# Patient Record
Sex: Female | Born: 1960 | Race: Black or African American | Hispanic: No | Marital: Married | Smoking: Never smoker
Health system: Southern US, Community
[De-identification: ages and names within clinical notes are randomized; demographics above are authoritative.]

## PROBLEM LIST (undated history)

## (undated) DIAGNOSIS — K219 Gastro-esophageal reflux disease without esophagitis: Secondary | ICD-10-CM

## (undated) DIAGNOSIS — M1990 Unspecified osteoarthritis, unspecified site: Secondary | ICD-10-CM

## (undated) DIAGNOSIS — I10 Essential (primary) hypertension: Secondary | ICD-10-CM

## (undated) DIAGNOSIS — Z8719 Personal history of other diseases of the digestive system: Secondary | ICD-10-CM

## (undated) HISTORY — DX: Essential (primary) hypertension: I10

## (undated) HISTORY — PX: LAPAROSCOPIC GASTRIC BAND REMOVAL WITH LAPAROSCOPIC GASTRIC SLEEVE RESECTION: SHX6498

## (undated) HISTORY — DX: Morbid (severe) obesity due to excess calories: E66.01

## (undated) HISTORY — PX: ROTATOR CUFF REPAIR: SHX139

---

## 2002-06-30 ENCOUNTER — Other Ambulatory Visit: Admission: RE | Admit: 2002-06-30 | Discharge: 2002-06-30 | Payer: Self-pay | Admitting: *Deleted

## 2002-07-28 ENCOUNTER — Encounter: Admission: RE | Admit: 2002-07-28 | Discharge: 2002-07-28 | Payer: Self-pay | Admitting: *Deleted

## 2002-08-02 ENCOUNTER — Encounter: Admission: RE | Admit: 2002-08-02 | Discharge: 2002-08-02 | Payer: Self-pay | Admitting: *Deleted

## 2003-05-26 ENCOUNTER — Encounter (INDEPENDENT_AMBULATORY_CARE_PROVIDER_SITE_OTHER): Payer: Self-pay | Admitting: *Deleted

## 2003-05-26 LAB — CONVERTED CEMR LAB

## 2005-07-31 ENCOUNTER — Emergency Department (HOSPITAL_COMMUNITY): Admission: EM | Admit: 2005-07-31 | Discharge: 2005-07-31 | Payer: Self-pay | Admitting: Family Medicine

## 2005-08-20 ENCOUNTER — Ambulatory Visit: Payer: Self-pay | Admitting: Internal Medicine

## 2005-09-14 ENCOUNTER — Ambulatory Visit: Payer: Self-pay | Admitting: Internal Medicine

## 2005-09-23 ENCOUNTER — Ambulatory Visit: Payer: Self-pay | Admitting: Internal Medicine

## 2005-09-25 ENCOUNTER — Ambulatory Visit (HOSPITAL_COMMUNITY): Admission: RE | Admit: 2005-09-25 | Discharge: 2005-09-25 | Payer: Self-pay | Admitting: Internal Medicine

## 2005-12-24 ENCOUNTER — Ambulatory Visit: Payer: Self-pay | Admitting: Internal Medicine

## 2007-01-20 ENCOUNTER — Encounter: Payer: Self-pay | Admitting: *Deleted

## 2007-01-20 DIAGNOSIS — G5600 Carpal tunnel syndrome, unspecified upper limb: Secondary | ICD-10-CM

## 2007-01-23 DIAGNOSIS — J309 Allergic rhinitis, unspecified: Secondary | ICD-10-CM | POA: Insufficient documentation

## 2007-01-23 DIAGNOSIS — I10 Essential (primary) hypertension: Secondary | ICD-10-CM | POA: Insufficient documentation

## 2007-01-23 DIAGNOSIS — E785 Hyperlipidemia, unspecified: Secondary | ICD-10-CM

## 2010-03-29 ENCOUNTER — Encounter: Admission: RE | Admit: 2010-03-29 | Discharge: 2010-03-29 | Payer: Self-pay | Admitting: Sports Medicine

## 2010-06-15 ENCOUNTER — Encounter: Payer: Self-pay | Admitting: Internal Medicine

## 2010-06-21 ENCOUNTER — Encounter
Admission: RE | Admit: 2010-06-21 | Discharge: 2010-06-21 | Payer: Self-pay | Source: Home / Self Care | Attending: Sports Medicine | Admitting: Sports Medicine

## 2011-12-03 ENCOUNTER — Ambulatory Visit (INDEPENDENT_AMBULATORY_CARE_PROVIDER_SITE_OTHER): Payer: BC Managed Care – PPO | Admitting: General Surgery

## 2011-12-03 DIAGNOSIS — M1990 Unspecified osteoarthritis, unspecified site: Secondary | ICD-10-CM

## 2011-12-03 DIAGNOSIS — E785 Hyperlipidemia, unspecified: Secondary | ICD-10-CM

## 2011-12-03 DIAGNOSIS — I10 Essential (primary) hypertension: Secondary | ICD-10-CM

## 2011-12-03 LAB — CBC WITH DIFFERENTIAL/PLATELET
Basophils Relative: 1 % (ref 0–1)
Eosinophils Absolute: 0.1 10*3/uL (ref 0.0–0.7)
Eosinophils Relative: 2 % (ref 0–5)
Hemoglobin: 12 g/dL (ref 12.0–15.0)
Lymphs Abs: 2.2 10*3/uL (ref 0.7–4.0)
MCH: 28.1 pg (ref 26.0–34.0)
MCHC: 33 g/dL (ref 30.0–36.0)
MCV: 85.2 fL (ref 78.0–100.0)
Monocytes Relative: 6 % (ref 3–12)
Neutrophils Relative %: 55 % (ref 43–77)
Platelets: 198 10*3/uL (ref 150–400)
RBC: 4.27 MIL/uL (ref 3.87–5.11)

## 2011-12-03 LAB — COMPREHENSIVE METABOLIC PANEL
Alkaline Phosphatase: 61 U/L (ref 39–117)
CO2: 25 mEq/L (ref 19–32)
Creat: 0.76 mg/dL (ref 0.50–1.10)
Glucose, Bld: 88 mg/dL (ref 70–99)
Sodium: 139 mEq/L (ref 135–145)
Total Bilirubin: 0.5 mg/dL (ref 0.3–1.2)

## 2011-12-03 LAB — TSH: TSH: 2.123 u[IU]/mL (ref 0.350–4.500)

## 2011-12-03 LAB — LIPID PANEL
Cholesterol: 189 mg/dL (ref 0–200)
HDL: 50 mg/dL (ref >39)
Triglycerides: 117 mg/dL (ref <150)

## 2011-12-03 LAB — T4: T4, Total: 10.6 ug/dL (ref 5.0–12.5)

## 2011-12-03 NOTE — Progress Notes (Signed)
Patient ID: Amanda Thomas, female   DOB: [birth year], 51 y.o.   MRN: [id number]  Chief Complaint  Patient presents with  . Weight Loss Surgery    bariatric Initial    HPI Amanda Thomas is a 51 y.o. female.   HPI This patient presents for evaluation for weight loss surgery. She has a BMI of 51 with comorbidities of arthritis, hypertension, borderline diabetes, and allergies. She says that she has struggled with her weight "all my life" she's tried several diets in the most affected was fasting for church. She is currently trying to walk and she is joined to walking club but her main complaint is her arthritis in her legs. She denies any history of reflux. She has attended our informational sessions and says that she is ready to make a change and become healthy.  No past medical history on file. Arthritis, hypertension, borderline diabetes mellitus, seasonal allergies No past surgical history on file. Right and leftrotator cuff surgery, C-section x2 No family history on file. See clinic intake form Social History History  Substance Use Topics  . Smoking status: Not on file  . Smokeless tobacco: Not on file  . Alcohol Use: Not on file  she denies smoking or alcohol or drug use  Allergies  Allergen Reactions  . Aspirin     REACTION: GI Upset  . Sulfonamide Derivatives     Current Outpatient Prescriptions  Medication Sig Dispense Refill  . medroxyPROGESTERone (PROVERA) 10 MG tablet       . meloxicam (MOBIC) 15 MG tablet       . MICARDIS 80 MG tablet       . montelukast (SINGULAIR) 10 MG tablet         Review of Systems Review of Systems All other review of systems negative or noncontributory except as stated in the HPI  Blood pressure 102/68, pulse 92, temperature 97.4 F (36.3 C), temperature source Temporal, height 5\' 7"  (1.702 m), weight 328 lb (148.78 kg).  Physical Exam Physical Exam Physical Exam  Nursing note and vitals reviewed. Constitutional: She is oriented  to person, place, and time. She appears well-developed and well-nourished. No distress.  HENT:  Head: Normocephalic and atraumatic.  Mouth/Throat: No oropharyngeal exudate.  Eyes: Conjunctivae and EOM are normal. Pupils are equal, round, and reactive to light. Right eye exhibits no discharge. Left eye exhibits no discharge. No scleral icterus.  Neck: Normal range of motion. Neck supple. No tracheal deviation present.  Cardiovascular: Normal rate, regular rhythm, normal heart sounds and intact distal pulses.   Pulmonary/Chest: Effort normal and breath sounds normal. No stridor. No respiratory distress. She has no wheezes.  Abdominal: Soft. Bowel sounds are normal. She exhibits no distension and no mass. There is no tenderness. There is no rebound and no guarding.  Musculoskeletal: Normal range of motion. She exhibits no edema and no tenderness.  Neurological: She is alert and oriented to person, place, and time.  Skin: Skin is warm and dry. No rash noted. She is not diaphoretic. No erythema. No pallor.  Psychiatric: She has a normal mood and affect. Her behavior is normal. Judgment and thought content normal.    Data Reviewed   Assessment    Morbid obesity with comorbidities of arthritis, hypertension, borderline diabetes mellitus. She has a BMI of 51 with several obesity related comorbidities I think should be fine candidate for any of the weight loss procedures. We did discuss the lap band, the sleeve gastrectomy, and the Roux-en-Y gastric bypass.  We discussed the risks and benefits and the pros and cons of each and she is most interested in a vertical sleeve gastrectomy. The risks of infection, bleeding, pain, scarring, weight regain, too little or too much weight loss, vitamin deficiencies and need for lifelong vitamin supplementation, hair loss, need for protein supplementation, leaks, stricture, reflux, food intolerance, need for reoperation and conversion to roux Y gastric bypass, need for  open surgery, injury to spleen or surrounding structures, DVT's, PE, and death again discussed with the patient and the patient expressed understanding and desires to proceed with laparoscopic vertical sleeve gastrectomy, possible open, intraoperative endoscopy.     Plan    We will go ahead and set her up with her nutrition labs, upper GI, nutrition and psychology evaluations and we will see her back after her preoperative workup.       Lodema Pilot DAVID 12/03/2011, 12:39 PM

## 2011-12-04 LAB — H. PYLORI ANTIBODY, IGG: H Pylori IgG: 0.46 {ISR}

## 2011-12-11 ENCOUNTER — Other Ambulatory Visit (INDEPENDENT_AMBULATORY_CARE_PROVIDER_SITE_OTHER): Payer: Self-pay

## 2011-12-11 DIAGNOSIS — E785 Hyperlipidemia, unspecified: Secondary | ICD-10-CM

## 2011-12-11 DIAGNOSIS — M1990 Unspecified osteoarthritis, unspecified site: Secondary | ICD-10-CM

## 2011-12-11 DIAGNOSIS — I10 Essential (primary) hypertension: Secondary | ICD-10-CM

## 2011-12-16 ENCOUNTER — Ambulatory Visit (HOSPITAL_COMMUNITY)
Admission: RE | Admit: 2011-12-16 | Discharge: 2011-12-16 | Disposition: A | Discharge status: Home / Self Care | Payer: BC Managed Care – PPO | Source: Ambulatory Visit | Attending: General Surgery | Admitting: General Surgery

## 2011-12-16 DIAGNOSIS — M129 Arthropathy, unspecified: Secondary | ICD-10-CM | POA: Insufficient documentation

## 2011-12-16 DIAGNOSIS — Z6841 Body Mass Index (BMI) 40.0 and over, adult: Secondary | ICD-10-CM | POA: Insufficient documentation

## 2011-12-16 DIAGNOSIS — I10 Essential (primary) hypertension: Secondary | ICD-10-CM | POA: Insufficient documentation

## 2011-12-16 DIAGNOSIS — E119 Type 2 diabetes mellitus without complications: Secondary | ICD-10-CM | POA: Insufficient documentation

## 2011-12-16 DIAGNOSIS — E785 Hyperlipidemia, unspecified: Secondary | ICD-10-CM | POA: Insufficient documentation

## 2011-12-28 ENCOUNTER — Encounter: Payer: BC Managed Care – PPO | Attending: General Surgery | Admitting: *Deleted

## 2011-12-28 ENCOUNTER — Encounter: Payer: Self-pay | Admitting: *Deleted

## 2011-12-28 DIAGNOSIS — Z713 Dietary counseling and surveillance: Secondary | ICD-10-CM | POA: Insufficient documentation

## 2011-12-28 DIAGNOSIS — Z01818 Encounter for other preprocedural examination: Secondary | ICD-10-CM | POA: Insufficient documentation

## 2011-12-28 NOTE — Progress Notes (Signed)
  Pre-Op Assessment Visit:  Pre-Operative Sleeve Gastrectomy Surgery  Medical Nutrition Therapy:  Appt start time: 0930   End time:  1030.  Patient was seen on 12/28/2011 for Pre-Operative Sleeve Gastrectomy Nutrition Assessment. Assessment and letter of approval faxed to [facility] Bariatric Surgery Program coordinator on 12/28/2011.  Approval letter sent to [facility] center and will be available in the chart under the media tab.  Handouts given during visit include:  Pre-Op Goals   Bariatric Surgery Protein Shakes handout  Patient to call for Pre-Op and Post-Op Nutrition Education at the [facility] when surgery is scheduled.

## 2011-12-28 NOTE — Patient Instructions (Addendum)
   Follow Pre-Op Nutrition Goals to prepare for Gastric Sleeve Surgery.   Call the [facility] at [phone] once you have been given your surgery date to enrolled in the Pre-Op Nutrition Class. You will need to attend this nutrition class 3-4 weeks prior to your surgery.  

## 2013-05-09 ENCOUNTER — Ambulatory Visit (INDEPENDENT_AMBULATORY_CARE_PROVIDER_SITE_OTHER): Payer: BC Managed Care – PPO | Admitting: Family Medicine

## 2013-05-09 ENCOUNTER — Other Ambulatory Visit (INDEPENDENT_AMBULATORY_CARE_PROVIDER_SITE_OTHER): Payer: BC Managed Care – PPO

## 2013-05-09 ENCOUNTER — Encounter: Payer: Self-pay | Admitting: Family Medicine

## 2013-05-09 VITALS — BP 134/84 | HR 84 | Wt 325.0 lb

## 2013-05-09 DIAGNOSIS — M25569 Pain in unspecified knee: Secondary | ICD-10-CM

## 2013-05-09 DIAGNOSIS — M7052 Other bursitis of knee, left knee: Secondary | ICD-10-CM | POA: Insufficient documentation

## 2013-05-09 DIAGNOSIS — M25562 Pain in left knee: Secondary | ICD-10-CM

## 2013-05-09 DIAGNOSIS — M545 Low back pain: Secondary | ICD-10-CM

## 2013-05-09 DIAGNOSIS — IMO0002 Reserved for concepts with insufficient information to code with codable children: Secondary | ICD-10-CM

## 2013-05-09 MED ORDER — CYCLOBENZAPRINE HCL 10 MG PO TABS: 10.0000 mg | ORAL_TABLET | Freq: Three times a day (TID) | ORAL | Status: DC | PRN

## 2013-05-09 MED ORDER — TRAMADOL HCL 50 MG PO TABS: 50.0000 mg | ORAL_TABLET | Freq: Every evening | ORAL | Status: DC | PRN

## 2013-05-09 NOTE — Patient Instructions (Signed)
Always good to see you Flexeril you can use if having muscle spasm.  Tramadol to take at night if needed.  Do exercises daily.  Ice 20 minutes 2 times a day Other medicines like Vitamin D you have on the list [employer] orthotics at [employer] [facility] or [facility].  Come back again in 4 weeks.

## 2013-05-09 NOTE — Progress Notes (Signed)
CC: Back and knee pain  HPI: Patient is a 52 year old female coming in with a history of low back pain. Patient states that her back pain seems to be worsening. Patient did have an MRI previously it was reviewed by me. Patient's MRI in 2011 showed very mild disc bulging at L4-L5 and L5-S1 but no central canal narrowing or foraminal stenosis. Patient states that she has a chronic dull aching of the lumbar spine intermittently. Patient denies any radiculopathy to the legs, any numbness or any weakness of the large obese. Patient has tried some Tylenol as well as vitamin D recently which has been helpful. Patient is wondering if there is anything else that she can do. States that the pain is approximately 3/10.  Patient is also complaining of left knee pain. Patient points just distal to the knee joint on the medial aspect. Patient states it seems to be very difficult to extend her knee. Patient has been walking a significant more than she usually does recently. Patient states that he can and ice seems to help this area. Discuss it is more of a throbbing sensation that is tender to touch. Patient has been told previously that she does have arthritis of the knee. Patient denies any radiation, numbness or any tingling. Patient rates this pain approximately 7/10.  Past medical, surgical, family and social history reviewed. Medications reviewed all in the electronic medical record.   Review of Systems: No headache, visual changes, nausea, vomiting, diarrhea, constipation, dizziness, abdominal pain, skin rash, fevers, chills, night sweats, weight loss, swollen lymph nodes, body aches, joint swelling, muscle aches, chest pain, shortness of breath, mood changes.   Objective:    Blood pressure 134/84, pulse 84, weight 325 lb (147.419 kg), SpO2 96.00%.   General: No apparent distress alert and oriented x3 mood and affect normal, dressed appropriately.  Obese HEENT: Pupils equal, extraocular movements  intact Respiratory: Patient's speak in full sentences and does not appear short of breath Cardiovascular: No lower extremity edema, non tender, no erythema Skin: Warm dry intact with no signs of infection or rash on extremities or on axial skeleton. Abdomen: Soft nontender Neuro: Cranial nerves II through XII are intact, neurovascularly intact in all extremities with 2+ DTRs and 2+ pulses. Lymph: No lymphadenopathy of posterior or anterior cervical chain or axillae bilaterally.  Gait normal with good balance and coordination.  MSK: Non tender with full range of motion and good stability and symmetric strength and tone of shoulders, elbows, wrist, hip, and ankles bilaterally.  Back Exam:  Inspection: Unremarkable  Motion: Flexion 45 deg, Extension 45 deg, Side Bending to 45 deg bilaterally,  Rotation to 45 deg bilaterally  SLR laying: Negative  XSLR laying: Negative  Palpable tenderness: None. FABER: negative. Sensory change: Gross sensation intact to all lumbar and sacral dermatomes.  Reflexes: 2+ at both patellar tendons, 2+ at achilles tendons, Babinski's downgoing.  Strength at foot  Plantar-flexion: 5/5 Dorsi-flexion: 5/5 Eversion: 5/5 Inversion: 5/5  Leg strength  Quad: 5/5 Hamstring: 5/5 the Hip flexor: 5/5 Hip abductors: 5/5  Gait unremarkable. Knee: Left Normal to inspection with no erythema or effusion or obvious bony abnormalities. Difficulty finding landmarks secondary to patient's body habitus Palpation normal with no warmth, joint line tenderness, patellar tenderness, or condyle tenderness. Patient is tender to palpation of the pes anserine area. ROM full in flexion and extension and lower leg rotation. Ligaments with solid consistent endpoints including ACL, PCL, LCL, MCL. Negative Mcmurray's, Apley's, and Thessalonian tests. Mild painful patellar compression.  Patellar glide with moderate crepitus. Patellar and quadriceps tendons unremarkable. Hamstring and quadriceps  strength is normal. Tightness of the hamstring bilaterally  MSK US performed of: Left knee This study was ordered, performed, and interpreted by Terrilee Files D.O.  Knee: All structures visualized. Anteromedial, anterolateral, posteromedial, and posterolateral menisci unremarkable without tearing, fraying, effusion, or displacement. Patient does have moderate osteophytic changes and narrowing of the joint space medial and lateral. Patellar Tendon unremarkable on long and transverse views without effusion. No abnormality of prepatellar bursa. LCL and MCL unremarkable on long and transverse views. No abnormality of origin of medial or lateral head of the gastrocnemius. Pes Anserine is significantly large hypoechoic  IMPRESSION: Moderate osteophytes changes with pes anserine bursitis.   After verbal consent patient was prepped with a call swabs and was injected with a 22-gauge 1-1/2 inch needle into the pes anserine area. Patient did have significant decrease in pain immediately. Patient did have 1 cc of 0.5% Marcaine and 1 cc of 40 mg/dL  Kenalog. Pain almost completely resolved.  Impression and Recommendations:     This case required medical decision making of moderate complexity.

## 2013-05-09 NOTE — Progress Notes (Signed)
Pre-visit discussion using our clinic review tool. No additional management support is needed unless otherwise documented below in the visit note.  

## 2013-05-09 NOTE — Assessment & Plan Note (Signed)
Multifactorial secondary to her obesity. Patient was given home exercises, Flexeril, anti-inflammatories she is taking and we discussed other over-the-counter medications he can be beneficial. We also discussed shoes and other things that could be helpful. Patient will come back again in one month and if she continues to have pain we'll consider x-rays as well as potentially formal physical therapy.

## 2013-05-09 NOTE — Assessment & Plan Note (Signed)
Patient did have injection under ultrasound guidance today and pictures were saved. Patient tolerated the procedure well with near complete resolution pain. She will still do the meloxicam. Patient was given home exercise program with handout. Patient will do this on her regular basis. Patient will follow up in one month. If she continues to have pain we'll consider further imaging. Patient may also need formal physical therapy.

## 2013-06-09 ENCOUNTER — Ambulatory Visit (INDEPENDENT_AMBULATORY_CARE_PROVIDER_SITE_OTHER): Payer: BC Managed Care – PPO | Admitting: Family Medicine

## 2013-06-09 ENCOUNTER — Encounter: Payer: Self-pay | Admitting: Family Medicine

## 2013-06-09 VITALS — BP 146/72 | HR 92 | Temp 97.2°F | Resp 18 | Wt 325.1 lb

## 2013-06-09 DIAGNOSIS — M545 Low back pain, unspecified: Secondary | ICD-10-CM

## 2013-06-09 DIAGNOSIS — M25562 Pain in left knee: Secondary | ICD-10-CM | POA: Insufficient documentation

## 2013-06-09 DIAGNOSIS — M25569 Pain in unspecified knee: Secondary | ICD-10-CM

## 2013-06-09 NOTE — Assessment & Plan Note (Signed)
Patient seems to be doing very well overall. Patient will continue with the current exercises 3 times a week for the next 6 weeks. Patient will then followup on an as-needed basis as long as she continues to do well.

## 2013-06-09 NOTE — Progress Notes (Signed)
CC: Back and knee pain follow up   HPI: Patient is a 53 year old female coming in with a history of low back pain.   Patient's MRI in 2011 showed very mild disc bulging at L4-L5 and L5-S1 but no central canal narrowing or foraminal stenosis.  Patient continues vitamin D. as well as Tylenol and was given home exercise program. Patient states she is feeling significantly better. Patient states that she is only having intermittent pain from time to time. Patient denies any radiation to the legs any numbness. Patient states that her back seems to be near her baseline.  Patient also complained of left knee pain and was diagnosed with pes anserine bursitis. Patient did have an injection at last visit and was given exercises. Patient states that her knee pain has improved as well. Patient though does give history of the knee where when she wakes up in the morning is stuck into position and seems to not improve until she has a popping sensation. Once the popping sensation occurs and she is able to angulate without any difficulty. Patient denies any radiation or numbness. Patient states the front of her knee though is seen to be pain free.   Past medical, surgical, family and social history reviewed. Medications reviewed all in the electronic medical record.   Review of Systems: No headache, visual changes, nausea, vomiting, diarrhea, constipation, dizziness, abdominal pain, skin rash, fevers, chills, night sweats, weight loss, swollen lymph nodes, body aches, joint swelling, muscle aches, chest pain, shortness of breath, mood changes.   Objective:    Blood pressure 146/72, pulse 92, temperature 97.2 F (36.2 C), temperature source Oral, resp. rate 18, weight 325 lb 1.3 oz (147.455 kg), SpO2 96.00%.   General: No apparent distress alert and oriented x3 mood and affect normal, dressed appropriately.  Obese HEENT: Pupils equal, extraocular movements intact Respiratory: Patient's speak in full sentences and  does not appear short of breath Cardiovascular: No lower extremity edema, non tender, no erythema Skin: Warm dry intact with no signs of infection or rash on extremities or on axial skeleton. Abdomen: Soft nontender Neuro: Cranial nerves II through XII are intact, neurovascularly intact in all extremities with 2+ DTRs and 2+ pulses. Lymph: No lymphadenopathy of posterior or anterior cervical chain or axillae bilaterally.  Gait normal with good balance and coordination.  MSK: Non tender with full range of motion and good stability and symmetric strength and tone of shoulders, elbows, wrist, hip, and ankles bilaterally.  Back Exam:  Inspection: Unremarkable  Motion: Flexion 45 deg, Extension 45 deg, Side Bending to 45 deg bilaterally,  Rotation to 45 deg bilaterally  SLR laying: Negative  XSLR laying: Negative  Palpable tenderness: None. FABER: negative. Sensory change: Gross sensation intact to all lumbar and sacral dermatomes.  Reflexes: 2+ at both patellar tendons, 2+ at achilles tendons, Babinski's downgoing.  Strength at foot  Plantar-flexion: 5/5 Dorsi-flexion: 5/5 Eversion: 5/5 Inversion: 5/5  Leg strength  Quad: 5/5 Hamstring: 5/5 the Hip flexor: 5/5 Hip abductors: 5/5  Gait unremarkable. Knee: Left Normal to inspection with no erythema or effusion or obvious bony abnormalities. Palpation normal with no warmth, joint line tenderness, patellar tenderness, or condyle tenderness.  ROM full in flexion and extension and lower leg rotation. Ligaments with solid consistent endpoints including ACL, PCL, LCL, MCL. Mild positive Mcmurray's, Apley's, and Thessalonian tests. Mild painful patellar compression. Patellar glide with moderate crepitus. Patellar and quadriceps tendons unremarkable. Hamstring and quadriceps strength is normal. Tightness of the hamstring bilaterally  Impression and Recommendations:     This case required medical decision making of moderate complexity.

## 2013-06-09 NOTE — Patient Instructions (Signed)
It is wonderful to see you.  Try a couple new exercises for your knee Only do exercises now 3 times a week.  Continue the vitamins I think they are helping.  Start iron 325mg  daily for the cramping.  If not better come back and see me.  Also if your knee starts to lock up more come back and see me and we can try an injection in the knee joint.  Hopefully you will not need this.

## 2013-06-09 NOTE — Assessment & Plan Note (Signed)
Patient does have left knee pain that still seems to be intermittent. Patient does give a history of locking which makes me concerned for potential meniscal tear. Patient has not had any swelling now and continues to improve per her history. I do not see any meniscal tear on last ultrasound and we'll continue to monitor. Patient was given another home exercise more representative of a meniscal tear to try. Patient has no improvement or seems to have increasing frequency of locking mechanisms and she will come back and we'll try a steroid injection.

## 2013-07-05 ENCOUNTER — Encounter: Payer: Self-pay | Admitting: Family Medicine

## 2013-07-05 ENCOUNTER — Ambulatory Visit (HOSPITAL_COMMUNITY)
Admission: RE | Admit: 2013-07-05 | Discharge: 2013-07-05 | Disposition: A | Discharge status: Home / Self Care | Payer: BC Managed Care – PPO | Source: Ambulatory Visit | Attending: Family Medicine | Admitting: Family Medicine

## 2013-07-05 ENCOUNTER — Encounter: Payer: Self-pay | Admitting: *Deleted

## 2013-07-05 ENCOUNTER — Ambulatory Visit (INDEPENDENT_AMBULATORY_CARE_PROVIDER_SITE_OTHER): Payer: BC Managed Care – PPO | Admitting: Family Medicine

## 2013-07-05 VITALS — BP 132/84 | HR 89 | Temp 97.7°F | Resp 18 | Wt 333.0 lb

## 2013-07-05 DIAGNOSIS — M25562 Pain in left knee: Secondary | ICD-10-CM

## 2013-07-05 DIAGNOSIS — M25569 Pain in unspecified knee: Secondary | ICD-10-CM | POA: Insufficient documentation

## 2013-07-05 DIAGNOSIS — M7052 Other bursitis of knee, left knee: Secondary | ICD-10-CM

## 2013-07-05 DIAGNOSIS — M1710 Unilateral primary osteoarthritis, unspecified knee: Secondary | ICD-10-CM | POA: Insufficient documentation

## 2013-07-05 DIAGNOSIS — IMO0002 Reserved for concepts with insufficient information to code with codable children: Secondary | ICD-10-CM

## 2013-07-05 NOTE — Assessment & Plan Note (Signed)
Concern with patient having likely a meniscal tear with a questionable loose body. We will get x-rays to evaluate amount of arthritis as well as any loose bodies there. Patient was given injection today and did have some mild relief in pain but still she stated felt somewhat unstable. Patient given home exercise and will do some icing. Patient will call back next week. If having more difficulty we will do an MRI at that time.  Spent greater than 25 minutes with patient face-to-face and had greater than 50% of counseling including as described above in assessment and plan.

## 2013-07-05 NOTE — Progress Notes (Signed)
  CC: knee pain follow up   HPI: Patient is here following up for her left knee pain. Patient was diagnosed previously with the pes anserine bursitis but was having symptoms that correspond somewhat to the meniscal injury. Patient was given home exercises now focusing more on the meniscal injury. Patient also was given a icing protocol and discussed over-the-counter medications that can be beneficial. Patient states on Monday she woke up at which was unable to actually flex patient's left knee. Patient states that it feels like something is loose in her knee can touch her from time to time he didn't feel like she can fall. Patient has been walking with the aid of a cane since that time. Denies any radiation of pain or any numbness. Patient though has been out of work because she feels significant discomfort at all times.   Past medical, surgical, family and social history reviewed. Medications reviewed all in the electronic medical record.   Review of Systems: No headache, visual changes, nausea, vomiting, diarrhea, constipation, dizziness, abdominal pain, skin rash, fevers, chills, night sweats, weight loss, swollen lymph nodes, body aches, joint swelling, muscle aches, chest pain, shortness of breath, mood changes.   Objective:    Blood pressure 132/84, pulse 89, temperature 97.7 F (36.5 C), temperature source Oral, resp. rate 18, weight 333 lb (151.048 kg), SpO2 97.00%.   General: No apparent distress alert and oriented x3 mood and affect normal, dressed appropriately.  Obese HEENT: Pupils equal, extraocular movements intact Respiratory: Patient's speak in full sentences and does not appear short of breath Cardiovascular: No lower extremity edema, non tender, no erythema Skin: Warm dry intact with no signs of infection or rash on extremities or on axial skeleton. Abdomen: Soft nontender Neuro: Cranial nerves II through XII are intact, neurovascularly intact in all extremities with 2+ DTRs and  2+ pulses. Lymph: No lymphadenopathy of posterior or anterior cervical chain or axillae bilaterally.  Gait antalgic walking with a cane MSK: Non tender with full range of motion and good stability and symmetric strength and tone of shoulders, elbows, wrist, hip, and ankles bilaterally.  Knee: Left Normal to inspection with no erythema or effusion or obvious bony abnormalities. Palpation normal with no warmth, joint line tenderness, patellar tenderness, or condyle tenderness.  ROM full in flexion and extension and lower leg rotation. Ligaments with solid consistent endpoints including ACL, PCL, LCL, MCL. Mild positive Mcmurray's, Apley's, and Thessalonian tests. Mild painful patellar compression. Patellar glide with moderate crepitus. Patellar and quadriceps tendons unremarkable. Hamstring and quadriceps strength is normal. Tightness of the hamstring bilaterally  After informed written and verbal consent, patient was seated on exam table. Left knee was prepped with alcohol swab and utilizing anterolateral approach, patient's left knee space was injected with 4:1  marcaine 0.5%: Kenalog 40mg /dL. Patient tolerated the procedure well without immediate complications.   Impression and Recommendations:     This case required medical decision making of moderate complexity.

## 2013-07-05 NOTE — Patient Instructions (Signed)
Good to see you as always.  Two injections today Try to restart exercises starting tomorrow.  Ice 20 minute 2 times a day Go downstairs and get xrays.  Call me Thursday or Friday of next week.  If better then we will continue what we are doing.  If worse or no better we will order MRI.  If we do that then I want to see you 1-2 days after MRi.

## 2013-07-05 NOTE — Progress Notes (Signed)
Pre-visit discussion using our clinic review tool. No additional management support is needed unless otherwise documented below in the visit note.  

## 2013-07-08 ENCOUNTER — Encounter: Payer: Self-pay | Admitting: Family Medicine

## 2013-07-10 ENCOUNTER — Telehealth: Payer: Self-pay | Admitting: Family Medicine

## 2013-07-10 NOTE — Telephone Encounter (Signed)
Patient called and discussed xray.  Bad OA but will continue to monitor.

## 2013-08-01 ENCOUNTER — Other Ambulatory Visit: Payer: Self-pay | Admitting: Family Medicine

## 2013-08-07 ENCOUNTER — Encounter: Payer: Self-pay | Admitting: Family Medicine

## 2013-08-07 ENCOUNTER — Other Ambulatory Visit: Payer: Self-pay | Admitting: Family Medicine

## 2013-08-07 MED ORDER — MELOXICAM 15 MG PO TABS: 15.0000 mg | ORAL_TABLET | Freq: Every day | ORAL | Status: DC

## 2013-08-18 NOTE — Telephone Encounter (Signed)
Refill done.  

## 2013-08-22 DIAGNOSIS — IMO0002 Reserved for concepts with insufficient information to code with codable children: Secondary | ICD-10-CM | POA: Insufficient documentation

## 2013-08-22 DIAGNOSIS — M2550 Pain in unspecified joint: Secondary | ICD-10-CM | POA: Insufficient documentation

## 2013-12-09 DIAGNOSIS — Z713 Dietary counseling and surveillance: Secondary | ICD-10-CM | POA: Insufficient documentation

## 2014-01-29 ENCOUNTER — Other Ambulatory Visit: Payer: Self-pay | Admitting: Family Medicine

## 2014-01-30 NOTE — Telephone Encounter (Signed)
Refill done.  

## 2014-03-09 ENCOUNTER — Other Ambulatory Visit: Payer: Self-pay

## 2014-03-23 ENCOUNTER — Encounter: Payer: Self-pay | Admitting: Family Medicine

## 2014-03-23 ENCOUNTER — Ambulatory Visit (INDEPENDENT_AMBULATORY_CARE_PROVIDER_SITE_OTHER): Payer: BC Managed Care – PPO | Admitting: Family Medicine

## 2014-03-23 VITALS — BP 122/80 | HR 67 | Ht 66.0 in | Wt 311.0 lb

## 2014-03-23 DIAGNOSIS — M545 Low back pain, unspecified: Secondary | ICD-10-CM

## 2014-03-23 DIAGNOSIS — S86112A Strain of other muscle(s) and tendon(s) of posterior muscle group at lower leg level, left leg, initial encounter: Secondary | ICD-10-CM

## 2014-03-23 DIAGNOSIS — M179 Osteoarthritis of knee, unspecified: Secondary | ICD-10-CM

## 2014-03-23 DIAGNOSIS — M1710 Unilateral primary osteoarthritis, unspecified knee: Secondary | ICD-10-CM | POA: Insufficient documentation

## 2014-03-23 MED ORDER — DICLOFENAC SODIUM 2 % TD SOLN: TRANSDERMAL | Status: DC

## 2014-03-23 NOTE — Progress Notes (Signed)
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 Patient has had quite a steroid injections before. Patient states at first her last injections greater than 8 months ago did not seem to help the knee were doing very well until the last 2 months. Patient has been encouraged to try to lose weight she is going to have gastric bypass surgery in the next 3 weeks.. Patient continues to go to the gym on most days of the week but states that she has had decreasing activity she is doing secondary to the knee pain. Patient states that this knee pain seems to be starting to give her difficulty with her back as well as her left ankle as well. States that it's more of a cramping sensation. States that it is a dull aching pain that is waking her up at night in both areas. Denies any radiation down the leg or any numbness or tingling. Patient denies any giving out on her of the left ankle.   Past medical, surgical, family and social history reviewed. Medications reviewed all in the electronic medical record.   Review of Systems: No headache, visual changes, nausea, vomiting, diarrhea, constipation, dizziness, abdominal pain, skin rash, fevers, chills, night sweats, weight loss, swollen lymph nodes, body aches, joint swelling, muscle aches, chest pain, shortness of breath, mood changes.   Objective:    Blood pressure 122/80, pulse 67, height 5\' 6"  (1.676 m), weight 311 lb (141.069 kg), SpO2 97.00%.   General: No apparent distress alert and oriented x3 mood and affect normal, dressed appropriately.  Obese HEENT: Pupils equal, extraocular movements intact Respiratory: Patient's speak in full sentences and does not appear short of breath Cardiovascular: No lower extremity edema, non tender, no erythema Skin: Warm dry intact with no signs of infection or rash on extremities or on axial skeleton. Abdomen: Soft nontender Neuro: Cranial nerves II through XII are  intact, neurovascularly intact in all extremities with 2+ DTRs and 2+ pulses. Lymph: No lymphadenopathy of posterior or anterior cervical chain or axillae bilaterally.  Gait antalgic walking with a cane MSK: Non tender with full range of motion and good stability and symmetric strength and tone of shoulders, elbows, wrist, hip, and ankles bilaterally.  Back Exam:  Inspection: Unremarkable  Motion: Flexion 45 deg, Extension 45 deg, Side Bending to 45 deg bilaterally,  Rotation to 45 deg bilaterally  SLR laying: Negative  XSLR laying: Negative  Palpable tenderness: Tender to the patient over the right SI joint FABER: Positive right Sensory change: Gross sensation intact to all lumbar and sacral dermatomes.  Reflexes: 2+ at both patellar tendons, 2+ at achilles tendons, Babinski's downgoing.  Strength at foot  Plantar-flexion: 5/5 Dorsi-flexion: 5/5 Eversion: 5/5 Inversion: 5/5  Leg strength  Quad: 5/5 Hamstring: 5/5 Hip flexor: 5/5 Hip abductors: 5/5  Gait unremarkable.  Knee: Left Normal to inspection with no erythema or effusion or obvious bony abnormalities. Tender to palpation over the medial joint line bilaterally  ROM full in flexion and extension and lower leg rotation. Ligaments with solid consistent endpoints including ACL, PCL, LCL, MCL. Mild positive Mcmurray's, Apley's, and Thessalonian tests. Mild painful patellar compression. Patellar glide with moderate crepitus. Patellar and quadriceps tendons unremarkable. Hamstring and quadriceps strength is normal. Tightness of the hamstring bilaterally mild tenderness to the left calf muscle itself.  Procedure: Real-time Ultrasound Guided Injection of right knee Device: GE Logiq E  Ultrasound guided injection is preferred based studies that show increased  duration, increased effect, greater accuracy, decreased procedural pain, increased response rate, and decreased cost with ultrasound guided versus blind injection.  Verbal  informed consent obtained.  Time-out conducted.  Noted no overlying erythema, induration, or other signs of local infection.  Skin prepped in a sterile fashion.  Local anesthesia: Topical Ethyl chloride.  With sterile technique and under real time ultrasound guidance: With a 22-gauge 2 inch needle patient was injected with 4 cc of 0.5% Marcaine and 1 cc of Kenalog 40 mg/dL. This was from a superior lateral approach.  Completed without difficulty  Pain immediately resolved suggesting accurate placement of the medication.  Advised to call if fevers/chills, erythema, induration, drainage, or persistent bleeding.  Images permanently stored and available for review in the ultrasound unit.  Impression: Technically successful ultrasound guided injection.   Procedure: Real-time Ultrasound Guided Injection of left knee Device: GE Logiq E  Ultrasound guided injection is preferred based studies that show increased duration, increased effect, greater accuracy, decreased procedural pain, increased response rate, and decreased cost with ultrasound guided versus blind injection.  Verbal informed consent obtained.  Time-out conducted.  Noted no overlying erythema, induration, or other signs of local infection.  Skin prepped in a sterile fashion.  Local anesthesia: Topical Ethyl chloride.  With sterile technique and under real time ultrasound guidance: With a 22-gauge 2 inch needle patient was injected with 4 cc of 0.5% Marcaine and 1 cc of Kenalog 40 mg/dL. This was from a superior lateral approach.  Completed without difficulty  Pain immediately resolved suggesting accurate placement of the medication.  Advised to call if fevers/chills, erythema, induration, drainage, or persistent bleeding.  Images permanently stored and available for review in the ultrasound unit.  Impression: Technically successful ultrasound guided injection.   Impression and Recommendations:     This case required medical  decision making of moderate complexity.

## 2014-03-23 NOTE — Patient Instructions (Signed)
You are doing great.  I wish you the best on the surgery Try the pennsaid 2 times daily. Stop the meloxicam Exercises 3 times a week.  For your calf and back.  Ice is your friend.  See me when you need me.

## 2014-03-23 NOTE — Assessment & Plan Note (Signed)
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 I do not feel that further imaging is necessary at this time. I do feel that once patient has bariatric surgery she likely improve with the weight loss. Patient was given home exercises and discussed natural supplementations a can be beneficial. Patient was given topical anti-inflammatories and told to avoid oral anti-inflammatories from now on. Patient will try these interventions and come back and see me again in 3 weeks.

## 2014-03-23 NOTE — Assessment & Plan Note (Signed)
Advanced osteophytic changes of the knees bilaterally. Ultrasound guided injections done today. Patient has home exercises and has gone through formal physical therapy. Discussed icing protocol. Patient declined a brace today. Patient has had one in the past. Patient will continue with a conservative therapy and follow-up with me in 3 weeks for further evaluation.

## 2014-03-23 NOTE — Assessment & Plan Note (Signed)
Discuss compression sleeve, icing, proper shoes as well as over-the-counter heel lift. Patient will try these interventions and given home exercise handout. Patient will come back and see me again in 3 weeks.

## 2014-04-16 ENCOUNTER — Telehealth: Payer: Self-pay | Admitting: Physician Assistant

## 2014-04-16 MED ORDER — DICLOFENAC SODIUM 2 % TD SOLN: TRANSDERMAL | Status: DC

## 2014-04-16 NOTE — Telephone Encounter (Signed)
Inquiring about mail order arthritis gel. Pt thought Dr Katrinka BlazingSmith was going to send prescription for this. Pt has not heard anything. Pls advise

## 2014-04-16 NOTE — Telephone Encounter (Signed)
Rx re-sent into pharmacy. Pt made aware.

## 2014-04-26 ENCOUNTER — Other Ambulatory Visit: Payer: Self-pay | Admitting: Family Medicine

## 2014-04-26 NOTE — Telephone Encounter (Signed)
Refill done.  

## 2014-05-07 DIAGNOSIS — Z8719 Personal history of other diseases of the digestive system: Secondary | ICD-10-CM | POA: Insufficient documentation

## 2014-05-07 DIAGNOSIS — Z903 Acquired absence of stomach [part of]: Secondary | ICD-10-CM | POA: Insufficient documentation

## 2014-05-07 DIAGNOSIS — Z9889 Other specified postprocedural states: Secondary | ICD-10-CM | POA: Insufficient documentation

## 2014-08-06 DIAGNOSIS — L304 Erythema intertrigo: Secondary | ICD-10-CM | POA: Insufficient documentation

## 2014-08-06 DIAGNOSIS — L987 Excessive and redundant skin and subcutaneous tissue: Secondary | ICD-10-CM | POA: Insufficient documentation

## 2014-08-06 DIAGNOSIS — L919 Hypertrophic disorder of the skin, unspecified: Secondary | ICD-10-CM | POA: Insufficient documentation

## 2015-02-11 ENCOUNTER — Ambulatory Visit (INDEPENDENT_AMBULATORY_CARE_PROVIDER_SITE_OTHER): Payer: Federal, State, Local not specified - PPO | Admitting: Family Medicine

## 2015-02-11 ENCOUNTER — Encounter: Payer: Self-pay | Admitting: Family Medicine

## 2015-02-11 VITALS — BP 118/80 | HR 63 | Ht 66.0 in | Wt 247.0 lb

## 2015-02-11 DIAGNOSIS — M533 Sacrococcygeal disorders, not elsewhere classified: Secondary | ICD-10-CM | POA: Diagnosis not present

## 2015-02-11 NOTE — Progress Notes (Signed)
CC: Back pain follow-up  HPI patient is here for back pain. Patient was seen previously and has had significant weight loss. Patient states that her knees been feeling good and because of this. Patient unfortunately continues to have back pain. Patient states that it is more of a dull throbbing aching sensation mostly on the right side. Seems to be localized more into the right buttocks region. An states that certain activities seem to make it worse. Patient's states that she's been trying to work out and be very active but has difficulty secondary to this pain. Can even wake her up at night. Denies any radiation down the legs or any weakness. Rates the severity of pain a 8 out of 10. Has tried some home modalities that does not seem to help including topical anti-inflammatory. Muscle relaxer and tramadol does help minorly.   Past medical, surgical, family and social history reviewed. Medications reviewed all in the electronic medical record.   Review of Systems: No headache, visual changes, nausea, vomiting, diarrhea, constipation, dizziness, abdominal pain, skin rash, fevers, chills, night sweats, weight loss, swollen lymph nodes, body aches, joint swelling, muscle aches, chest pain, shortness of breath, mood changes.   Objective:    Blood pressure 118/80, pulse 63, height  (1.676 m), weight 247 lb (112.038 kg), SpO2 98 %.   General: No apparent distress alert and oriented x3 mood and affect normal, dressed appropriately.  Obese HEENT: Pupils equal, extraocular movements intact Respiratory: Patient's speak in full sentences and does not appear short of breath Cardiovascular: No lower extremity edema, non tender, no erythema Skin: Warm dry intact with no signs of infection or rash on extremities or on axial skeleton. Abdomen: Soft nontender Neuro: Cranial nerves II through XII are intact, neurovascularly intact in all extremities with 2+ DTRs and 2+ pulses. Lymph: No lymphadenopathy of  posterior or anterior cervical chain or axillae bilaterally.  Gait antalgic walking with a cane MSK: Non tender with full range of motion and good stability and symmetric strength and tone of shoulders, elbows, wrist, hip, and ankles bilaterally.  Back Exam:  Inspection: Unremarkable  Motion: Flexion 45 deg, Extension 45 deg, Side Bending to 45 deg bilaterally,  Rotation to 45 deg bilaterally  SLR laying: Negative  XSLR laying: Negative  Palpable tenderness: Tender to the patient over the right SI joint FABER: Positive right Sensory change: Gross sensation intact to all lumbar and sacral dermatomes.  Reflexes: 2+ at both patellar tendons, 2+ at achilles tendons, Babinski's downgoing.  Strength at foot  Plantar-flexion: 5/5 Dorsi-flexion: 5/5 Eversion: 5/5 Inversion: 5/5  Leg strength  Quad: 5/5 Hamstring: 5/5 Hip flexor: 5/5 Hip abductors: 5/5  Gait unremarkable  Procedure note 97110; 15 minutes spent for Therapeutic exercises as stated in above notes.  This included exercises focusing on stretching, strengthening, with significant focus on eccentric aspects.  Sacroiliac Joint Mobilization and Rehab 1. Work on pretzel stretching, shoulder back and leg draped in front. 3-5 sets, 30 sec.. 2. hip abductor rotations. standing, hip flexion and rotation outward then inward. 3 sets, 15 reps. when can do comfortably, add ankle weights starting at 2 pounds.  3. cross over stretching - shoulder back to ground, same side leg crossover. 3-5 sets for 30 min..  4. rolling up and back knees to chest and rocking. 5. sacral tilt - 5 sets, hold for 5-10 seconds  Proper technique shown and discussed handout in great detail with ATC.  All questions were discussed and answered.  Impression and Recommendations:     This case required medical decision making of moderate complexity.

## 2015-02-11 NOTE — Patient Instructions (Signed)
Good to see you. You look great Ice 20 minutes 2 times daily. Usually after activity and before bed. Exercises 3 daily Iron  elemental iron daily Vitamin D 2000 Iu daily B12 daily Consider turmeric  twice daily Sacroiliac Joint Mobilization and Rehab 1. Work on pretzel stretching, shoulder back and leg draped in front. 3-5 sets, 30 sec.. 2. hip abductor rotations. standing, hip flexion and rotation outward then inward. 3 sets, 15 reps. when can do comfortably, add ankle weights starting at 2 pounds.  3. cross over stretching - shoulder back to ground, same side leg crossover. 3-5 sets for 30 min..  4. rolling up and back knees to chest and rocking. 5. sacral tilt - 5 sets, hold for 5-10 seconds Exercises on wall.  Heel and butt touching.  Raise leg 6 inches and hold 2 seconds.  Down slow for count of 4 seconds.  1 set of 30 reps daily on both sides.  See me again in 4 weeks. If not better we will do injection.

## 2015-02-11 NOTE — Progress Notes (Signed)
Pre visit review using our clinic review tool, if applicable. No additional management support is needed unless otherwise documented below in the visit note. 

## 2015-02-11 NOTE — Assessment & Plan Note (Signed)
Patient does have known arthritic changes of the back pain likely is contribute in. I do think the left patient pain seems to be more associated to the sacroiliac joint as well as the piriformis muscle. We discussed home exercises and patient work with Event organiser today. We discussed icing, manual massage and manipulation, as well as oral anti-inflammatory's. We discussed what activities to do in the importance of hip abductor and core strengthening. Patient had a make these different changes and come back and see me again in 4-6 weeks for further evaluation. If continuing have pain we'll consider injection and possibly formal physical therapy.

## 2015-03-11 ENCOUNTER — Ambulatory Visit: Payer: Federal, State, Local not specified - PPO | Admitting: Family Medicine

## 2015-03-11 DIAGNOSIS — Z0289 Encounter for other administrative examinations: Secondary | ICD-10-CM

## 2017-01-19 ENCOUNTER — Other Ambulatory Visit: Payer: Self-pay | Admitting: Obstetrics & Gynecology

## 2017-01-19 DIAGNOSIS — R928 Other abnormal and inconclusive findings on diagnostic imaging of breast: Secondary | ICD-10-CM

## 2017-01-21 ENCOUNTER — Ambulatory Visit
Admission: RE | Admit: 2017-01-21 | Discharge: 2017-01-21 | Disposition: A | Discharge status: Home / Self Care | Payer: Federal, State, Local not specified - PPO | Source: Ambulatory Visit | Attending: Obstetrics & Gynecology | Admitting: Obstetrics & Gynecology

## 2017-01-21 DIAGNOSIS — R928 Other abnormal and inconclusive findings on diagnostic imaging of breast: Secondary | ICD-10-CM

## 2017-05-25 HISTORY — PX: BREAST EXCISIONAL BIOPSY: SUR124

## 2017-06-21 ENCOUNTER — Other Ambulatory Visit: Payer: Self-pay | Admitting: Obstetrics & Gynecology

## 2017-06-21 DIAGNOSIS — R921 Mammographic calcification found on diagnostic imaging of breast: Secondary | ICD-10-CM

## 2017-07-23 ENCOUNTER — Inpatient Hospital Stay
Admission: RE | Admit: 2017-07-23 | Discharge: 2017-07-23 | Disposition: A | Discharge status: Home / Self Care | Payer: Federal, State, Local not specified - PPO | Source: Ambulatory Visit | Attending: Obstetrics & Gynecology | Admitting: Obstetrics & Gynecology

## 2017-07-23 ENCOUNTER — Ambulatory Visit
Admission: RE | Admit: 2017-07-23 | Discharge: 2017-07-23 | Disposition: A | Discharge status: Home / Self Care | Payer: Federal, State, Local not specified - PPO | Source: Ambulatory Visit | Attending: Obstetrics & Gynecology | Admitting: Obstetrics & Gynecology

## 2017-07-23 ENCOUNTER — Other Ambulatory Visit: Payer: Self-pay | Admitting: Obstetrics & Gynecology

## 2017-07-23 DIAGNOSIS — R921 Mammographic calcification found on diagnostic imaging of breast: Secondary | ICD-10-CM

## 2017-07-26 ENCOUNTER — Ambulatory Visit
Admission: RE | Admit: 2017-07-26 | Discharge: 2017-07-26 | Disposition: A | Discharge status: Home / Self Care | Payer: Federal, State, Local not specified - PPO | Source: Ambulatory Visit | Attending: Obstetrics & Gynecology | Admitting: Obstetrics & Gynecology

## 2017-07-26 ENCOUNTER — Other Ambulatory Visit: Payer: Self-pay | Admitting: Obstetrics & Gynecology

## 2017-07-26 DIAGNOSIS — R921 Mammographic calcification found on diagnostic imaging of breast: Secondary | ICD-10-CM

## 2017-08-05 ENCOUNTER — Other Ambulatory Visit: Payer: Self-pay | Admitting: General Surgery

## 2017-08-05 DIAGNOSIS — N6091 Unspecified benign mammary dysplasia of right breast: Secondary | ICD-10-CM

## 2017-08-23 ENCOUNTER — Other Ambulatory Visit: Payer: Self-pay

## 2017-08-23 ENCOUNTER — Encounter (HOSPITAL_BASED_OUTPATIENT_CLINIC_OR_DEPARTMENT_OTHER): Payer: Self-pay | Admitting: *Deleted

## 2017-08-29 NOTE — H&P (Signed)
Amanda Thomas [facility] Patient #: [id number] DOB: 09/30/1960 Married / Language: English / Race: Black or African AmeriDrue Thomas Female        History of Present Illness          This is a 57 year old woman, referred by Dr. Si GaulHu at the [facility] for evaluation of atypical ductal hyperplasia right breast, upper outer quadrant. Amanda CashingSarah Spencer, PA is her primary care provider. Amanda HonourMegan Thomas, D.O. is her gynecologist.       She has no prior breast problems. She gets annual screening mammography. She was called back for a 6 month imaging and they found two adjacent groups of calcifications in the right breast upper outer quadrant. One area is 2.7 cm and one is 5 mm. Image guided biopsy of the larger group shows atypical ductal hyperplasia and flat epithelial atypia. The clip is stated to be 1 cm superior to the calcifications. She has done well.        Past history reveals sleeve gastrectomy at the 2015. BMI 40. Hypertension. 2 C-sections. Bilateral rotator cuff surgery Family history reveals breast cancer in a maternal aunt age 59 and a maternal first cousin at age 59. She doesn't know if they've had genetic testing. No family history of ovarian cancer or colon cancer. Her brother has low-grade prostate cancer Social history reveals she is married [place] has 2 children. Retired from the [employer]. Denies alcohol or tobacco ever      I explained the implications of ADH to her and her husband. I quoted a 10-15% risk of low-grade cancer at this time and also discussed long-term risk when considered in light of her family history. I have recommended excision of this area and she completely agrees. She'll be scheduled for right breast lumpectomy with radioactive seed localization. I discussed the indications, details, techniques, and risks of the surgery in detail. She is aware of the risk of bleeding, infection, cosmetic deformity, chronic pain, seroma formation,  reoperation of cancer, and other unforeseen problems. She understands all of these issues. All of her questions are answered. She agrees with this plan.       Once we know the final pathology, consideration will be given to referral to high risk breast clinic in light of her family history and the ADH.   Past Surgical History  Breast Biopsy  Right. Cesarean Section - Multiple  Colon Polyp Removal - Colonoscopy  Laparoscopic Inguinal Hernia Surgery  Left. Oral Surgery  Shoulder Surgery  Bilateral. Sleeve Gastrectomy   Diagnostic Studies History  Colonoscopy  1-5 years ago Mammogram  within last year Pap Smear  1-5 years ago  Allergies Amanda Thomas(Michelle R. Brooks, CMA; 08/05/2017 8:27 AM) Sulfa 10 *OPHTHALMIC AGENTS*   Medication History Vitamin D3 (Oral) Specific strength unknown - Active. Multi-Vitamin (Oral) Active. Meloxicam (15MG  Tablet, Oral prn) Active. Micardis (80MG  Tablet, Oral) Active. Singulair (10MG  Tablet, Oral prn) Active. Vitamin B-12 (Oral) Specific strength unknown - Active. Claritin (Oral prn) Specific strength unknown - Active. Medications Reconciled  Social History  No alcohol use  No caffeine use  No drug use  Tobacco use  Never smoker.  Family History  Breast Cancer  Family Members In General. Family history unknown  First Degree Relatives  Heart Disease  Brother. Heart disease in female family member before age 59  Prostate Cancer  Brother.  Pregnancy / Birth History  Age at menarche  10 years. Age of menopause  [id number] Gravida  2 Irregular periods  Length (months)  of breastfeeding  3-6 Maternal age  59-20 Para  2  Other Problems  Arthritis  Back Pain  Diverticulosis  High blood pressure  Inguinal Hernia     Review of Systems  General Not Present- Appetite Loss, Chills, Fatigue, Fever, Night Sweats, Weight Gain and Weight Loss. Skin Not Present- Change in Wart/Mole, Dryness, Hives, Jaundice, New  Lesions, Non-Healing Wounds, Rash and Ulcer. HEENT Present- Seasonal Allergies, Sinus Pain and Wears glasses/contact lenses. Not Present- Earache, Hearing Loss, Hoarseness, Nose Bleed, Oral Ulcers, Ringing in the Ears, Sore Throat, Visual Disturbances and Yellow Eyes. Respiratory Not Present- Bloody sputum, Chronic Cough, Difficulty Breathing, Snoring and Wheezing. Breast Present- Breast Pain. Not Present- Breast Mass, Nipple Discharge and Skin Changes. Cardiovascular Present- Leg Cramps. Not Present- Chest Pain, Difficulty Breathing Lying Down, Palpitations, Rapid Heart Rate, Shortness of Breath and Swelling of Extremities. Gastrointestinal Present- Gets full quickly at meals and Indigestion. Not Present- Abdominal Pain, Bloating, Bloody Stool, Change in Bowel Habits, Chronic diarrhea, Constipation, Difficulty Swallowing, Excessive gas, Hemorrhoids, Nausea, Rectal Pain and Vomiting. Female Genitourinary Not Present- Frequency, Nocturia, Painful Urination, Pelvic Pain and Urgency. Musculoskeletal Present- Back Pain, Joint Stiffness and Muscle Pain. Not Present- Joint Pain, Muscle Weakness and Swelling of Extremities. Neurological Present- Numbness. Not Present- Decreased Memory, Fainting, Headaches, Seizures, Tingling, Tremor, Trouble walking and Weakness. Psychiatric Not Present- Anxiety, Bipolar, Change in Sleep Pattern, Depression, Fearful and Frequent crying. Endocrine Not Present- Cold Intolerance, Excessive Hunger, Hair Changes, Heat Intolerance, Hot flashes and New Diabetes. Hematology Not Present- Blood Thinners, Easy Bruising, Excessive bleeding, Gland problems, HIV and Persistent Infections.  Vitals Weight: 260.13 lb Height: 67in Body Surface Area: 2.26 m Body Mass Index: 40.74 kg/m  BP: 124/82 (Sitting, Left Arm, Standard)    Physical Exam  General Mental Status-Alert. General Appearance-Consistent with stated age. Hydration-Well hydrated. Voice-Normal. Note:  BMI 40   Head and Neck Head-normocephalic, atraumatic with no lesions or palpable masses. Trachea-midline. Thyroid Gland Characteristics - normal size and consistency.  Eye Eyeball - Bilateral-Extraocular movements intact. Sclera/Conjunctiva - Bilateral-No scleral icterus.  Chest and Lung Exam Chest and lung exam reveals -quiet, even and easy respiratory effort with no use of accessory muscles and on auscultation, normal breath sounds, no adventitious sounds and normal vocal resonance. Inspection Chest Wall - Normal. Back - normal.  Breast Note: Breasts are medium size. Not huge. Biopsy site noted right upper outer. No hematoma or mass in either breast. No other skin changes. No axillary adenopathy.   Cardiovascular Cardiovascular examination reveals -normal heart sounds, regular rate and rhythm with no murmurs and normal pedal pulses bilaterally.  Abdomen Inspection Inspection of the abdomen reveals - No Hernias. Skin - Scar - Note: Trocar scars from sleeve gastrectomy. C-section scar. All well-healed. Palpation/Percussion Palpation and Percussion of the abdomen reveal - Soft, Non Tender, No Rebound tenderness, No Rigidity (guarding) and No hepatosplenomegaly. Auscultation Auscultation of the abdomen reveals - Bowel sounds normal.  Neurologic Neurologic evaluation reveals -alert and oriented x 3 with no impairment of recent or remote memory. Mental Status-Normal.  Musculoskeletal Normal Exam - Left-Upper Extremity Strength Normal and Lower Extremity Strength Normal. Normal Exam - Right-Upper Extremity Strength Normal and Lower Extremity Strength Normal.  Lymphatic Head & Neck  General Head & Neck Lymphatics: Bilateral - Description - Normal. Axillary  General Axillary Region: Bilateral - Description - Normal. Tenderness - Non Tender. Femoral & Inguinal  Generalized Femoral & Inguinal Lymphatics: Bilateral - Description - Normal. Tenderness - Non  Tender.    Assessment &  Plan  ATYPICAL DUCTAL HYPERPLASIA OF RIGHT BREAST (N60.91)  Your recent imaging studies and biopsy showed a small area of calcifications in the right breast, upper outer quadrant The biopsy shows atypical ductal hyperplasia You probably do not have cancer at this time, but this is a high risk lesion There is a 10-15% chance that you have early cancer right now  Surgical excision of this area is recommended and you state that you would like to do that you will be scheduled for right breast lumpectomy with radioactive seed localization I have discussed the indications, techniques, and risk of the surgery with you and your husband in detail  Because of this finding and because of your family history, consideration needs to be given to better assessment of your long-term risk for breast cancer. We can discuss that after we know the final results of this surgery.  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 Maternal first cousin age 52.

## 2017-08-30 ENCOUNTER — Ambulatory Visit
Admission: RE | Admit: 2017-08-30 | Discharge: 2017-08-30 | Disposition: A | Discharge status: Home / Self Care | Payer: Federal, State, Local not specified - PPO | Source: Ambulatory Visit | Attending: General Surgery | Admitting: General Surgery

## 2017-08-30 ENCOUNTER — Encounter (HOSPITAL_BASED_OUTPATIENT_CLINIC_OR_DEPARTMENT_OTHER)
Admission: RE | Admit: 2017-08-30 | Discharge: 2017-08-30 | Disposition: A | Discharge status: Home / Self Care | Payer: Federal, State, Local not specified - PPO | Source: Ambulatory Visit | Attending: General Surgery | Admitting: General Surgery

## 2017-08-30 ENCOUNTER — Other Ambulatory Visit: Payer: Self-pay | Admitting: General Surgery

## 2017-08-30 DIAGNOSIS — R921 Mammographic calcification found on diagnostic imaging of breast: Secondary | ICD-10-CM

## 2017-08-30 DIAGNOSIS — Z6841 Body Mass Index (BMI) 40.0 and over, adult: Secondary | ICD-10-CM | POA: Diagnosis not present

## 2017-08-30 DIAGNOSIS — Z79899 Other long term (current) drug therapy: Secondary | ICD-10-CM | POA: Diagnosis not present

## 2017-08-30 DIAGNOSIS — Z9884 Bariatric surgery status: Secondary | ICD-10-CM | POA: Diagnosis not present

## 2017-08-30 DIAGNOSIS — Z8042 Family history of malignant neoplasm of prostate: Secondary | ICD-10-CM | POA: Diagnosis not present

## 2017-08-30 DIAGNOSIS — Z791 Long term (current) use of non-steroidal anti-inflammatories (NSAID): Secondary | ICD-10-CM | POA: Diagnosis not present

## 2017-08-30 DIAGNOSIS — Z803 Family history of malignant neoplasm of breast: Secondary | ICD-10-CM | POA: Diagnosis not present

## 2017-08-30 DIAGNOSIS — N6091 Unspecified benign mammary dysplasia of right breast: Secondary | ICD-10-CM

## 2017-08-30 DIAGNOSIS — I10 Essential (primary) hypertension: Secondary | ICD-10-CM | POA: Diagnosis not present

## 2017-08-30 LAB — CBC WITH DIFFERENTIAL/PLATELET
BASOS ABS: 0 10*3/uL (ref 0.0–0.1)
BASOS PCT: 0 %
EOS ABS: 0.1 10*3/uL (ref 0.0–0.7)
Eosinophils Relative: 2 %
HEMATOCRIT: 35.3 % — AB (ref 36.0–46.0)
HEMOGLOBIN: 11.1 g/dL — AB (ref 12.0–15.0)
Lymphocytes Relative: 38 %
Lymphs Abs: 2.8 10*3/uL (ref 0.7–4.0)
MCH: 28 pg (ref 26.0–34.0)
MCHC: 31.4 g/dL (ref 30.0–36.0)
MCV: 89.1 fL (ref 78.0–100.0)
MONOS PCT: 5 %
Monocytes Absolute: 0.4 10*3/uL (ref 0.1–1.0)
NEUTROS ABS: 3.9 10*3/uL (ref 1.7–7.7)
NEUTROS PCT: 55 %
Platelets: 199 10*3/uL (ref 150–400)
RBC: 3.96 MIL/uL (ref 3.87–5.11)
RDW: 13.4 % (ref 11.5–15.5)
WBC: 7.2 10*3/uL (ref 4.0–10.5)

## 2017-08-30 LAB — COMPREHENSIVE METABOLIC PANEL
ALBUMIN: 3.4 g/dL — AB (ref 3.5–5.0)
ALT: 17 U/L (ref 14–54)
ANION GAP: 10 (ref 5–15)
AST: 21 U/L (ref 15–41)
Alkaline Phosphatase: 52 U/L (ref 38–126)
BUN: 23 mg/dL — AB (ref 6–20)
CALCIUM: 9.2 mg/dL (ref 8.9–10.3)
CO2: 23 mmol/L (ref 22–32)
Chloride: 105 mmol/L (ref 101–111)
Creatinine, Ser: 0.78 mg/dL (ref 0.44–1.00)
GFR calc Af Amer: >60 mL/min (ref >60)
GFR calc non Af Amer: >60 mL/min (ref >60)
GLUCOSE: 87 mg/dL (ref 65–99)
Potassium: 4.5 mmol/L (ref 3.5–5.1)
SODIUM: 138 mmol/L (ref 135–145)
Total Bilirubin: 0.4 mg/dL (ref 0.3–1.2)
Total Protein: 6.7 g/dL (ref 6.5–8.1)

## 2017-08-30 NOTE — Progress Notes (Signed)
Ensure pre surgery drink given with instructions to complete by 0445 dos, pt verbalized understanding. 

## 2017-08-31 ENCOUNTER — Ambulatory Visit (HOSPITAL_BASED_OUTPATIENT_CLINIC_OR_DEPARTMENT_OTHER)
Admission: RE | Admit: 2017-08-31 | Discharge: 2017-08-31 | Disposition: A | Discharge status: Home / Self Care | Payer: Federal, State, Local not specified - PPO | Source: Ambulatory Visit | Attending: General Surgery | Admitting: General Surgery

## 2017-08-31 ENCOUNTER — Ambulatory Visit
Admission: RE | Admit: 2017-08-31 | Discharge: 2017-08-31 | Disposition: A | Discharge status: Home / Self Care | Payer: Federal, State, Local not specified - PPO | Source: Ambulatory Visit | Attending: General Surgery | Admitting: General Surgery

## 2017-08-31 ENCOUNTER — Other Ambulatory Visit: Payer: Self-pay

## 2017-08-31 ENCOUNTER — Encounter (HOSPITAL_BASED_OUTPATIENT_CLINIC_OR_DEPARTMENT_OTHER)
Admission: RE | Disposition: A | Discharge status: Home / Self Care | Payer: Self-pay | Source: Ambulatory Visit | Attending: General Surgery

## 2017-08-31 ENCOUNTER — Ambulatory Visit (HOSPITAL_BASED_OUTPATIENT_CLINIC_OR_DEPARTMENT_OTHER): Payer: Federal, State, Local not specified - PPO | Admitting: Anesthesiology

## 2017-08-31 ENCOUNTER — Encounter (HOSPITAL_BASED_OUTPATIENT_CLINIC_OR_DEPARTMENT_OTHER): Payer: Self-pay | Admitting: *Deleted

## 2017-08-31 ENCOUNTER — Ambulatory Visit: Admit: 2017-08-31 | Payer: Federal, State, Local not specified - PPO

## 2017-08-31 DIAGNOSIS — Z79899 Other long term (current) drug therapy: Secondary | ICD-10-CM | POA: Insufficient documentation

## 2017-08-31 DIAGNOSIS — I10 Essential (primary) hypertension: Secondary | ICD-10-CM | POA: Insufficient documentation

## 2017-08-31 DIAGNOSIS — Z791 Long term (current) use of non-steroidal anti-inflammatories (NSAID): Secondary | ICD-10-CM | POA: Insufficient documentation

## 2017-08-31 DIAGNOSIS — N6091 Unspecified benign mammary dysplasia of right breast: Secondary | ICD-10-CM | POA: Diagnosis not present

## 2017-08-31 DIAGNOSIS — Z6841 Body Mass Index (BMI) 40.0 and over, adult: Secondary | ICD-10-CM | POA: Insufficient documentation

## 2017-08-31 DIAGNOSIS — R921 Mammographic calcification found on diagnostic imaging of breast: Secondary | ICD-10-CM

## 2017-08-31 DIAGNOSIS — Z8042 Family history of malignant neoplasm of prostate: Secondary | ICD-10-CM | POA: Insufficient documentation

## 2017-08-31 DIAGNOSIS — Z9884 Bariatric surgery status: Secondary | ICD-10-CM | POA: Insufficient documentation

## 2017-08-31 DIAGNOSIS — Z803 Family history of malignant neoplasm of breast: Secondary | ICD-10-CM | POA: Insufficient documentation

## 2017-08-31 HISTORY — DX: Personal history of other diseases of the digestive system: Z87.19

## 2017-08-31 HISTORY — DX: Gastro-esophageal reflux disease without esophagitis: K21.9

## 2017-08-31 HISTORY — DX: Unspecified osteoarthritis, unspecified site: M19.90

## 2017-08-31 HISTORY — PX: BREAST LUMPECTOMY WITH RADIOACTIVE SEED LOCALIZATION: SHX6424

## 2017-08-31 SURGERY — BREAST LUMPECTOMY WITH RADIOACTIVE SEED LOCALIZATION
Anesthesia: General | Site: Breast | Laterality: Right

## 2017-08-31 MED ORDER — ONDANSETRON HCL 4 MG/2ML IJ SOLN
INTRAMUSCULAR | Status: DC | PRN
  Administered 2017-08-31: 4 mg via INTRAVENOUS

## 2017-08-31 MED ORDER — GABAPENTIN 300 MG PO CAPS
300.0000 mg | ORAL_CAPSULE | ORAL | Status: AC
  Administered 2017-08-31: 300 mg via ORAL

## 2017-08-31 MED ORDER — PROPOFOL 10 MG/ML IV BOLUS
INTRAVENOUS | Status: AC
  Filled 2017-08-31: qty 20

## 2017-08-31 MED ORDER — FENTANYL CITRATE (PF) 100 MCG/2ML IJ SOLN
INTRAMUSCULAR | Status: AC
  Filled 2017-08-31: qty 2

## 2017-08-31 MED ORDER — DEXAMETHASONE SODIUM PHOSPHATE 10 MG/ML IJ SOLN
INTRAMUSCULAR | Status: AC
  Filled 2017-08-31: qty 1

## 2017-08-31 MED ORDER — DEXAMETHASONE SODIUM PHOSPHATE 4 MG/ML IJ SOLN
INTRAMUSCULAR | Status: DC | PRN
  Administered 2017-08-31: 10 mg via INTRAVENOUS

## 2017-08-31 MED ORDER — CHLORHEXIDINE GLUCONATE CLOTH 2 % EX PADS: 6.0000 | MEDICATED_PAD | Freq: Once | CUTANEOUS | Status: DC

## 2017-08-31 MED ORDER — CELECOXIB 200 MG PO CAPS
200.0000 mg | ORAL_CAPSULE | ORAL | Status: AC
  Administered 2017-08-31: 200 mg via ORAL

## 2017-08-31 MED ORDER — PHENYLEPHRINE 40 MCG/ML (10ML) SYRINGE FOR IV PUSH (FOR BLOOD PRESSURE SUPPORT)
PREFILLED_SYRINGE | INTRAVENOUS | Status: AC
Start: 2017-08-31 — End: 2017-08-31
  Filled 2017-08-31: qty 10

## 2017-08-31 MED ORDER — LIDOCAINE HCL (CARDIAC) 20 MG/ML IV SOLN
INTRAVENOUS | Status: DC | PRN
  Administered 2017-08-31: 30 mg via INTRAVENOUS

## 2017-08-31 MED ORDER — PROMETHAZINE HCL 25 MG/ML IJ SOLN: 6.2500 mg | INTRAMUSCULAR | Status: DC | PRN

## 2017-08-31 MED ORDER — HYDROMORPHONE HCL 1 MG/ML IJ SOLN: 0.2500 mg | INTRAMUSCULAR | Status: DC | PRN

## 2017-08-31 MED ORDER — SCOPOLAMINE 1 MG/3DAYS TD PT72: 1.0000 | MEDICATED_PATCH | Freq: Once | TRANSDERMAL | Status: DC | PRN

## 2017-08-31 MED ORDER — MEPERIDINE HCL 25 MG/ML IJ SOLN: 6.2500 mg | INTRAMUSCULAR | Status: DC | PRN

## 2017-08-31 MED ORDER — OXYCODONE HCL 5 MG/5ML PO SOLN: 5.0000 mg | Freq: Once | ORAL | Status: DC | PRN

## 2017-08-31 MED ORDER — LIDOCAINE HCL (CARDIAC) 20 MG/ML IV SOLN
INTRAVENOUS | Status: AC
Start: 2017-08-31 — End: 2017-08-31
  Filled 2017-08-31: qty 5

## 2017-08-31 MED ORDER — ONDANSETRON HCL 4 MG/2ML IJ SOLN
INTRAMUSCULAR | Status: AC
  Filled 2017-08-31: qty 2

## 2017-08-31 MED ORDER — ACETAMINOPHEN 500 MG PO TABS
ORAL_TABLET | ORAL | Status: AC
Start: 2017-08-31 — End: 2017-08-31
  Filled 2017-08-31: qty 2

## 2017-08-31 MED ORDER — ACETAMINOPHEN 500 MG PO TABS
1000.0000 mg | ORAL_TABLET | ORAL | Status: AC
  Administered 2017-08-31: 1000 mg via ORAL

## 2017-08-31 MED ORDER — GABAPENTIN 300 MG PO CAPS
ORAL_CAPSULE | ORAL | Status: AC
  Filled 2017-08-31: qty 1

## 2017-08-31 MED ORDER — DEXTROSE 5 % IV SOLN
3.0000 g | INTRAVENOUS | Status: AC
  Administered 2017-08-31: 3 g via INTRAVENOUS

## 2017-08-31 MED ORDER — MIDAZOLAM HCL 5 MG/5ML IJ SOLN
INTRAMUSCULAR | Status: DC | PRN
  Administered 2017-08-31: 2 mg via INTRAVENOUS

## 2017-08-31 MED ORDER — OXYCODONE HCL 5 MG PO TABS: 5.0000 mg | ORAL_TABLET | Freq: Once | ORAL | Status: DC | PRN

## 2017-08-31 MED ORDER — CEFAZOLIN SODIUM-DEXTROSE 2-4 GM/100ML-% IV SOLN
INTRAVENOUS | Status: AC
Start: 2017-08-31 — End: 2017-08-31
  Filled 2017-08-31: qty 100

## 2017-08-31 MED ORDER — LACTATED RINGERS IV SOLN
INTRAVENOUS | Status: DC
  Administered 2017-08-31 (×2): via INTRAVENOUS

## 2017-08-31 MED ORDER — CEFAZOLIN SODIUM-DEXTROSE 1-4 GM/50ML-% IV SOLN
INTRAVENOUS | Status: AC
  Filled 2017-08-31: qty 50

## 2017-08-31 MED ORDER — CELECOXIB 200 MG PO CAPS
ORAL_CAPSULE | ORAL | Status: AC
  Filled 2017-08-31: qty 1

## 2017-08-31 MED ORDER — HYDROCODONE-ACETAMINOPHEN 5-325 MG PO TABS: 1.0000 | ORAL_TABLET | Freq: Four times a day (QID) | ORAL | 0 refills | Status: DC | PRN

## 2017-08-31 MED ORDER — MIDAZOLAM HCL 2 MG/2ML IJ SOLN
INTRAMUSCULAR | Status: AC
  Filled 2017-08-31: qty 2

## 2017-08-31 MED ORDER — PROPOFOL 10 MG/ML IV BOLUS
INTRAVENOUS | Status: DC | PRN
  Administered 2017-08-31: 200 mg via INTRAVENOUS

## 2017-08-31 MED ORDER — FENTANYL CITRATE (PF) 100 MCG/2ML IJ SOLN
INTRAMUSCULAR | Status: DC | PRN
  Administered 2017-08-31: 100 ug via INTRAVENOUS
  Administered 2017-08-31: 25 ug via INTRAVENOUS

## 2017-08-31 MED ORDER — FENTANYL CITRATE (PF) 100 MCG/2ML IJ SOLN: 50.0000 ug | INTRAMUSCULAR | Status: DC | PRN

## 2017-08-31 MED ORDER — PHENYLEPHRINE HCL 10 MG/ML IJ SOLN
INTRAMUSCULAR | Status: DC | PRN
  Administered 2017-08-31: 80 ug via INTRAVENOUS
  Administered 2017-08-31: 40 ug via INTRAVENOUS

## 2017-08-31 MED ORDER — MIDAZOLAM HCL 2 MG/2ML IJ SOLN: 1.0000 mg | INTRAMUSCULAR | Status: DC | PRN

## 2017-08-31 SURGICAL SUPPLY — 67 items
ADH SKN CLS APL DERMABOND .7 (GAUZE/BANDAGES/DRESSINGS) ×1
APL SKNCLS STERI-STRIP NONHPOA (GAUZE/BANDAGES/DRESSINGS)
APPLIER CLIP 9.375 MED OPEN (MISCELLANEOUS) ×3
APR CLP MED 9.3 20 MLT OPN (MISCELLANEOUS) ×1
BENZOIN TINCTURE PRP APPL 2/3 (GAUZE/BANDAGES/DRESSINGS) IMPLANT
BINDER BREAST LRG (GAUZE/BANDAGES/DRESSINGS) IMPLANT
BINDER BREAST MEDIUM (GAUZE/BANDAGES/DRESSINGS) IMPLANT
BINDER BREAST XLRG (GAUZE/BANDAGES/DRESSINGS) ×2 IMPLANT
BINDER BREAST XXLRG (GAUZE/BANDAGES/DRESSINGS) IMPLANT
BLADE HEX COATED 2.75 (ELECTRODE) ×3 IMPLANT
BLADE SURG 10 STRL SS (BLADE) IMPLANT
BLADE SURG 15 STRL LF DISP TIS (BLADE) ×1 IMPLANT
BLADE SURG 15 STRL SS (BLADE) ×3
CANISTER SUC SOCK COL 7IN (MISCELLANEOUS) IMPLANT
CANISTER SUCT 1200ML W/VALVE (MISCELLANEOUS) ×3 IMPLANT
CHLORAPREP W/TINT 26ML (MISCELLANEOUS) ×3 IMPLANT
CLIP APPLIE 9.375 MED OPEN (MISCELLANEOUS) IMPLANT
CLOSURE WOUND 1/2 X4 (GAUZE/BANDAGES/DRESSINGS)
COVER BACK TABLE 60X90IN (DRAPES) ×3 IMPLANT
COVER MAYO STAND STRL (DRAPES) ×3 IMPLANT
COVER PROBE W GEL 5X96 (DRAPES) ×3 IMPLANT
DECANTER SPIKE VIAL GLASS SM (MISCELLANEOUS) IMPLANT
DERMABOND ADVANCED (GAUZE/BANDAGES/DRESSINGS) ×2
DERMABOND ADVANCED .7 DNX12 (GAUZE/BANDAGES/DRESSINGS) ×1 IMPLANT
DEVICE DUBIN W/COMP PLATE 8390 (MISCELLANEOUS) ×5 IMPLANT
DRAPE LAPAROSCOPIC ABDOMINAL (DRAPES) ×3 IMPLANT
DRAPE UTILITY XL STRL (DRAPES) ×3 IMPLANT
DRSG PAD ABDOMINAL 8X10 ST (GAUZE/BANDAGES/DRESSINGS) IMPLANT
ELECT REM PT RETURN 9FT ADLT (ELECTROSURGICAL) ×3
ELECTRODE REM PT RTRN 9FT ADLT (ELECTROSURGICAL) ×1 IMPLANT
GAUZE SPONGE 4X4 12PLY STRL LF (GAUZE/BANDAGES/DRESSINGS) IMPLANT
GLOVE BIO SURGEON STRL SZ 6.5 (GLOVE) ×1 IMPLANT
GLOVE BIO SURGEONS STRL SZ 6.5 (GLOVE) ×1
GLOVE BIOGEL PI IND STRL 7.0 (GLOVE) IMPLANT
GLOVE BIOGEL PI INDICATOR 7.0 (GLOVE) ×2
GLOVE EUDERMIC 7 POWDERFREE (GLOVE) ×3 IMPLANT
GOWN STRL REUS W/ TWL LRG LVL3 (GOWN DISPOSABLE) ×1 IMPLANT
GOWN STRL REUS W/ TWL XL LVL3 (GOWN DISPOSABLE) ×1 IMPLANT
GOWN STRL REUS W/TWL LRG LVL3 (GOWN DISPOSABLE) ×3
GOWN STRL REUS W/TWL XL LVL3 (GOWN DISPOSABLE) ×3
ILLUMINATOR WAVEGUIDE N/F (MISCELLANEOUS) IMPLANT
KIT MARKER MARGIN INK (KITS) ×3 IMPLANT
LIGHT WAVEGUIDE WIDE FLAT (MISCELLANEOUS) IMPLANT
NDL HYPO 25X1 1.5 SAFETY (NEEDLE) ×1 IMPLANT
NEEDLE HYPO 25X1 1.5 SAFETY (NEEDLE) ×3 IMPLANT
NS IRRIG 1000ML POUR BTL (IV SOLUTION) ×3 IMPLANT
PACK BASIN DAY SURGERY FS (CUSTOM PROCEDURE TRAY) ×3 IMPLANT
PENCIL BUTTON HOLSTER BLD 10FT (ELECTRODE) ×3 IMPLANT
SHEET MEDIUM DRAPE 40X70 STRL (DRAPES) IMPLANT
SLEEVE SCD COMPRESS KNEE MED (MISCELLANEOUS) ×3 IMPLANT
SPONGE LAP 18X18 RF (DISPOSABLE) IMPLANT
SPONGE LAP 4X18 X RAY DECT (DISPOSABLE) ×3 IMPLANT
STRIP CLOSURE SKIN 1/2X4 (GAUZE/BANDAGES/DRESSINGS) IMPLANT
SUT ETHILON 3 0 FSL (SUTURE) IMPLANT
SUT MNCRL AB 4-0 PS2 18 (SUTURE) ×3 IMPLANT
SUT SILK 2 0 SH (SUTURE) ×3 IMPLANT
SUT VIC AB 2-0 CT1 27 (SUTURE)
SUT VIC AB 2-0 CT1 TAPERPNT 27 (SUTURE) IMPLANT
SUT VIC AB 3-0 SH 27 (SUTURE)
SUT VIC AB 3-0 SH 27X BRD (SUTURE) IMPLANT
SUT VICRYL 3-0 CR8 SH (SUTURE) ×3 IMPLANT
SYR 10ML LL (SYRINGE) ×3 IMPLANT
TOWEL OR 17X24 6PK STRL BLUE (TOWEL DISPOSABLE) ×3 IMPLANT
TOWEL OR NON WOVEN STRL DISP B (DISPOSABLE) IMPLANT
TUBE CONNECTING 20'X1/4 (TUBING) ×1
TUBE CONNECTING 20X1/4 (TUBING) ×2 IMPLANT
YANKAUER SUCT BULB TIP NO VENT (SUCTIONS) ×3 IMPLANT

## 2017-08-31 NOTE — Transfer of Care (Signed)
Immediate Anesthesia Transfer of Care Note  Patient: Amanda Thomas  Procedure(s) Performed: RIGHT BREAST LUMPECTOMY WITH RADIOACTIVE SEED LOCALIZATION ERAS PATHWAY, TWO SEEDS (Right Breast)  Patient Location: PACU  Anesthesia Type:General  Level of Consciousness: awake  Airway & Oxygen Therapy: Patient Spontanous Breathing and Patient connected to face mask oxygen  Post-op Assessment: Report given to RN and Post -op Vital signs reviewed and stable  Post vital signs: Reviewed and stable  Last Vitals:  Vitals Value Taken Time  BP    Temp    Pulse 67 08/31/2017  8:52 AM  Resp    SpO2 100 % 08/31/2017  8:52 AM  Vitals shown include unvalidated device data.  Last Pain:  Vitals:   08/31/17 0657  TempSrc: Oral  PainSc: 0-No pain      Patients Stated Pain Goal: 0 (08/31/17 0657)  Complications: No apparent anesthesia complications

## 2017-08-31 NOTE — Anesthesia Postprocedure Evaluation (Signed)
Anesthesia Post Note  Patient: Amanda NovelBarbara F Thomas  Procedure(s) Performed: RIGHT BREAST LUMPECTOMY WITH RADIOACTIVE SEED LOCALIZATION ERAS PATHWAY, TWO SEEDS (Right Breast)     Patient location during evaluation: PACU Anesthesia Type: General Level of consciousness: awake and alert Pain management: pain level controlled Vital Signs Assessment: post-procedure vital signs reviewed and stable Respiratory status: spontaneous breathing, nonlabored ventilation and respiratory function stable Cardiovascular status: blood pressure returned to baseline and stable Postop Assessment: no apparent nausea or vomiting Anesthetic complications: no    Last Vitals:  Vitals:   08/31/17 0942 08/31/17 0954  BP:  126/60  Pulse: (!) 51 (!) 58  Resp: 20 18  Temp:  36.5 C  SpO2: 96% 93%    Last Pain:  Vitals:   08/31/17 0954  TempSrc:   PainSc: 0-No pain                 Lowella CurbWarren Ray [name]

## 2017-08-31 NOTE — Op Note (Signed)
Patient Name:           Amanda Thomas   Date of Surgery:        08/31/2017  Pre op Diagnosis:      Atypical ductal hyperplasia right breast, upper outer quadrant                                       Second, adjacent group of calcifications  Post op Diagnosis:    Same  Procedure:                 Right breast lumpectomy with radioactive seed localization x2  Surgeon:                     Edsel Petrin. Dalbert Batman, M.D., FACS  Assistant:                      OR staff  Operative Indications:    This is a 57 year old woman, referred by Dr. Melanee Spry at the [facility] for evaluation of atypical ductal hyperplasia right breast, upper outer quadrant. [name], PA is her primary care provider. Linda Hedges, D.O. is her gynecologist.       She has no prior breast problems. She gets annual screening mammography. She was called back for a 6 month imaging and they found two adjacent groups of calcifications in the right breast upper outer quadrant. One area is 2.7 cm and one is 5 mm. Image guided biopsy of the larger group shows atypical ductal hyperplasia and flat epithelial atypia. The clip is stated to be 1 cm superior to the calcifications. Yesterday, the radiologist placed to seeds.  Once he displaced in the biopsied area of calcifications and a second seed is placed in the second area of calcifications nearby.  The seeds appear to be 2-3 cm apart, but both near each other in the upper outer quadrant.  She has done well.     Family history reveals breast cancer in a maternal aunt age 59 and a maternal first cousin at age 3. She doesn't know if they've had genetic testing. No family history of ovarian cancer or colon cancer. Her brother has low-grade prostate cancerr      I explained the implications of ADH to her . I have recommended excision of this area and she completely agrees. She'll be scheduled for right breast lumpectomy with radioactive seed localization.n.       Once we know the final pathology,  consideration will be given to referral to the high risk breast clinic.    Operative Findings:       I was able to remove both areas and both seeds through a single curvilinear incision high in the upper outer quadrant of the right breast.  The posterior margin is the muscle.  The specimen mammogram looked good containing both radioactive seeds and the original marker clip in the first area.  I spent several minutes mapping out the 2 seeds with the neoprobe  Procedure in Detail:           Following the induction of general LMA anesthesia the patient's right breast was prepped and draped in a sterile fashion, intravenous antibiotics were given, and a surgical timeout was performed. 0.5% Marcaine with epinephrine was used as local infiltration anesthetic.     I spent a few minutes mapping out the 2 seeds with the neoprobe,  and correlating this with the displayed images.  I then planned my incision and made a curvilinear incision high in the upper outer right breast through skin crease area.  The lumpectomy was performed using electrocautery and the neoprobe.  The specimen was removed and marked with silk sutures and a 6 color ink kit to orient the pathologist.  The specimen mammogram looked very good as described above.  The specimen was marked and sent to the pathology lab where both seeds were retrieved.  The wound was irrigated with saline.  Hemostasis was achieved with electrocautery.  I marked the lumpectomy cavity with 5 metal marker clips.  The breast tissues were reapproximated in 2 separate layers with 3-0 Vicryl sutures and the skin closed with a running subcuticular 4-0 Monocryl and Dermabond.  The patient tolerated the procedure well and was taken to PACU in stable condition.  EBL 15-20 cc.  Counts correct.  Complications none.   Addendum: I logged on to the [place] website and reviewed her prescription medication history     Usbaldo Pannone M. Dalbert Batman, M.D., FACS General and Minimally Invasive  Surgery Breast and Colorectal Surgery  08/31/2017 8:47 AM

## 2017-08-31 NOTE — Anesthesia Procedure Notes (Signed)
Procedure Name: LMA Insertion Date/Time: 08/31/2017 7:51 AM Performed by: [place] DesanctisLinka, Emeterio Balke L, CRNA Pre-anesthesia Checklist: Patient identified, Emergency Drugs available, Suction available, Patient being monitored and Timeout performed Patient Re-evaluated:Patient Re-evaluated prior to induction Oxygen Delivery Method: Circle system utilized Preoxygenation: Pre-oxygenation with 100% oxygen Induction Type: IV induction Ventilation: Mask ventilation without difficulty LMA: LMA inserted LMA Size: 4.0 Number of attempts: 1 Airway Equipment and Method: Bite block Placement Confirmation: positive ETCO2 Tube secured with: Tape Dental Injury: Teeth and Oropharynx as per pre-operative assessment

## 2017-08-31 NOTE — Discharge Instructions (Signed)
[facility] Office Phone Number [phone]  BREAST BIOPSY/ LUMPECTOMY: POST OP INSTRUCTIONS  Always review your discharge instruction sheet given to you by the facility where your surgery was performed.  IF YOU HAVE DISABILITY OR FAMILY LEAVE FORMS, YOU MUST BRING THEM TO THE OFFICE FOR PROCESSING.  DO NOT GIVE THEM TO YOUR DOCTOR.  1. A prescription for pain medication may be given to you upon discharge.  Take your pain medication as prescribed, if needed.  If narcotic pain medicine is not needed, then you may take acetaminophen (Tylenol) or ibuprofen (Advil) as needed. *1,000mg  Tylenol taken at 7:02am* 2. Take your usually prescribed medications unless otherwise directed 3. If you need a refill on your pain medication, please contact your pharmacy.  They will contact our office to request authorization.  Prescriptions will not be filled after 5pm or on week-ends. 4. You should eat very light the first 24 hours after surgery, such as soup, crackers, pudding, etc.  Resume your normal diet the day after surgery. 5. Most patients will experience some swelling and bruising in the breast.  Ice packs and a good support bra will help.  Swelling and bruising can take several days to resolve.  6. It is common to experience some constipation if taking pain medication after surgery.  Increasing fluid intake and taking a stool softener will usually help or prevent this problem from occurring.  A mild laxative (Milk of Magnesia or Miralax) should be taken according to package directions if there are no bowel movements after 48 hours. 7. Unless discharge instructions indicate otherwise, you may remove your bandages 24-48 hours after surgery, and you may shower at that time.  You may have steri-strips (small skin tapes) in place directly over the incision.  These strips should be left on the skin for 7-10 days.  If your surgeon used skin glue on the incision, you may shower in 24 hours.  The glue  will flake off over the next 2-3 weeks.  Any sutures or staples will be removed at the office during your follow-up visit. 8. ACTIVITIES:  You may resume regular daily activities (gradually increasing) beginning the next day.  Wearing a good support bra or sports bra minimizes pain and swelling.  You may have sexual intercourse when it is comfortable. a. You may drive when you no longer are taking prescription pain medication, you can comfortably wear a seatbelt, and you can safely maneuver your car and apply brakes. b. RETURN TO WORK:  ______________________________________________________________________________________ 9. You should see your doctor in the office for a follow-up appointment approximately two weeks after your surgery.  Your doctors nurse will typically make your follow-up appointment when she calls you with your pathology report.  Expect your pathology report 2-3 business days after your surgery.  You may call to check if you do not hear from us after three days. 10. OTHER INSTRUCTIONS: _______________________________________________________________________________________________ _____________________________________________________________________________________________________________________________________ _____________________________________________________________________________________________________________________________________ _____________________________________________________________________________________________________________________________________  WHEN TO CALL YOUR DOCTOR: 1. Fever over 101.0 2. Nausea and/or vomiting. 3. Extreme swelling or bruising. 4. Continued bleeding from incision. 5. Increased pain, redness, or drainage from the incision.  The clinic staff is available to answer your questions during regular business hours.  Please dont hesitate to call and ask to speak to one of the nurses for clinical concerns.  If you have a medical  emergency, go to the nearest emergency room or call 911.  A surgeon from [facility] is always on call at the hospital.  For further questions,  please visit [email or web address]    Post Anesthesia Home Care Instructions  Activity: Get plenty of rest for the remainder of the day. A responsible individual must stay with you for 24 hours following the procedure.  For the next 24 hours, DO NOT: -Drive a car -Paediatric nurse -Drink alcoholic beverages -Take any medication unless instructed by your physician -Make any legal decisions or sign important papers.  Meals: Start with liquid foods such as gelatin or soup. Progress to regular foods as tolerated. Avoid greasy, spicy, heavy foods. If nausea and/or vomiting occur, drink only clear liquids until the nausea and/or vomiting subsides. Call your physician if vomiting continues.  Special Instructions/Symptoms: Your throat may feel dry or sore from the anesthesia or the breathing tube placed in your throat during surgery. If this causes discomfort, gargle with warm salt water. The discomfort should disappear within 24 hours.  If you had a scopolamine patch placed behind your ear for the management of post- operative nausea and/or vomiting:  1. The medication in the patch is effective for 72 hours, after which it should be removed.  Wrap patch in a tissue and discard in the trash. Wash hands thoroughly with soap and water. 2. You may remove the patch earlier than 72 hours if you experience unpleasant side effects which may include dry mouth, dizziness or visual disturbances. 3. Avoid touching the patch. Wash your hands with soap and water after contact with the patch.

## 2017-08-31 NOTE — Anesthesia Preprocedure Evaluation (Signed)
Anesthesia Evaluation  Patient identified by MRN, date of birth, ID band Patient awake    Reviewed: Allergy & Precautions, NPO status , Patient's Chart, lab work & pertinent test results  Airway Mallampati: II  TM Distance: >3 FB Neck ROM: Full    Dental no notable dental hx.    Pulmonary neg pulmonary ROS,    Pulmonary exam normal breath sounds clear to auscultation       Cardiovascular hypertension, Pt. on medications negative cardio ROS Normal cardiovascular exam Rhythm:Regular Rate:Normal     Neuro/Psych negative neurological ROS  negative psych ROS   GI/Hepatic negative GI ROS, Neg liver ROS, GERD  ,  Endo/Other  negative endocrine ROSMorbid obesity  Renal/GU negative Renal ROS  negative genitourinary   Musculoskeletal negative musculoskeletal ROS (+) Arthritis , Osteoarthritis,    Abdominal   Peds negative pediatric ROS (+)  Hematology negative hematology ROS (+)   Anesthesia Other Findings   Reproductive/Obstetrics negative OB ROS                             Anesthesia Physical Anesthesia Plan  ASA: III  Anesthesia Plan: General   Post-op Pain Management:    Induction: Intravenous  PONV Risk Score and Plan: 3 and Ondansetron, Dexamethasone and Midazolam  Airway Management Planned: LMA  Additional Equipment:   Intra-op Plan:   Post-operative Plan: Extubation in OR  Informed Consent: I have reviewed the patients History and Physical, chart, labs and discussed the procedure including the risks, benefits and alternatives for the proposed anesthesia with the patient or authorized representative who has indicated his/her understanding and acceptance.   Dental advisory given  Plan Discussed with: CRNA  Anesthesia Plan Comments:         Anesthesia Quick Evaluation

## 2017-08-31 NOTE — Interval H&P Note (Signed)
History and Physical Interval Note:  08/31/2017 7:36 AM  Amanda Thomas  has presented today for surgery, with the diagnosis of atypical ductal hyperplasia right breast  The various methods of treatment have been discussed with the patient and family. After consideration of risks, benefits and other options for treatment, the patient has consented to  Procedure(s): RIGHT BREAST LUMPECTOMY WITH RADIOACTIVE SEED LOCALIZATION ERAS PATHWAY, TWO SEEDS (Right) as a surgical intervention .  The patient's history has been reviewed, patient examined, no change in status, stable for surgery.  I have reviewed the patient's chart and labs.  Questions were answered to the patient's satisfaction.     Ernestene MentionHaywood M Hanne Kegg

## 2017-09-01 ENCOUNTER — Encounter (HOSPITAL_BASED_OUTPATIENT_CLINIC_OR_DEPARTMENT_OTHER): Payer: Self-pay | Admitting: General Surgery

## 2017-09-01 NOTE — Progress Notes (Signed)
Inform patient of Pathology report,. Tell her that the breast pathology shows atypical ductal hyperplasia, but no cancer This is good news  no further surgery will be required I will discuss this with her in detail at her next office visit Please let me know that you reached her  hmi

## 2017-09-15 ENCOUNTER — Encounter: Payer: Self-pay | Admitting: Hematology and Oncology

## 2017-09-15 ENCOUNTER — Telehealth: Payer: Self-pay | Admitting: Hematology and Oncology

## 2017-09-15 NOTE — Telephone Encounter (Signed)
Appt has been scheduled for the pt to see Dr. Pamelia HoitGudena on 5/14 at 1pm. Pt aware to arrive 30 minutes early. Letter mailed.

## 2017-10-05 ENCOUNTER — Inpatient Hospital Stay
Payer: Federal, State, Local not specified - PPO | Attending: Hematology and Oncology | Admitting: Hematology and Oncology

## 2017-10-05 DIAGNOSIS — K449 Diaphragmatic hernia without obstruction or gangrene: Secondary | ICD-10-CM | POA: Diagnosis not present

## 2017-10-05 DIAGNOSIS — I10 Essential (primary) hypertension: Secondary | ICD-10-CM

## 2017-10-05 DIAGNOSIS — N6091 Unspecified benign mammary dysplasia of right breast: Secondary | ICD-10-CM | POA: Diagnosis not present

## 2017-10-05 DIAGNOSIS — Z79899 Other long term (current) drug therapy: Secondary | ICD-10-CM | POA: Diagnosis not present

## 2017-10-05 DIAGNOSIS — K219 Gastro-esophageal reflux disease without esophagitis: Secondary | ICD-10-CM | POA: Insufficient documentation

## 2017-10-05 NOTE — Assessment & Plan Note (Signed)
08/31/2017: Right breast lumpectomy: Atypical ductal hyperplasia with associated microcalcifications Pathology review: I discussed the difference between atypical ductal hyperplasia, DCIS and invasive breast cancer. I explained to her that atypical ductal hyperplasia is characterized by a proliferation of uniform epithelial cells filling part, but not the entirety, of the involved duct. ADH is associated with an increased risk of both ipsilateral and contralateral breast cancer and thus provides evidence of underlying breast abnormalities that predispose to breast cancer.   Prognosis:Using the [employer] breast cancer risk assessment tool, her risk of breast cancer in 5 years is at 1.1% ( average woman's risk is 0.6%), her lifetime risk of breast cancers at 15.4% ( average risk is a 10%)  I discussed the risks and benefits of tamoxifen therapy. I believe that the risks far outweigh any benefits from tamoxifen. Instead I recommended the following 1. Exercise 30 minutes daily 2. Weight loss 3. Increasing fruits and vegetables and less red meat I believe all of the above would extensively decrease her risk of breast cancer as well as other cancers including cancers of the colon substantially.  Surveillance plan: Annual mammograms and breast exams

## 2017-10-05 NOTE — Progress Notes (Signed)
[facility] CONSULT NOTE  Patient Care Team: Lucila Maine as PCP - General (Physician Assistant) Himmelrich, Loree Fee, RD (Inactive) as Dietitian  CHIEF COMPLAINTS/PURPOSE OF CONSULTATION:  Newly diagnosed right breast atypical ductal hyperplasia  HISTORY OF PRESENTING ILLNESS:  Amanda Thomas 57 y.o. female is here because of recent diagnosis of right breast atypical ductal hyperplasia.  Patient had a mammogram that detected 2 groups of indeterminate calcifications in the right breast upper outer quadrant middle depth larger group 2.7 x 0.8 x 0.8 cm and smaller group was 5 mm.  Stereotactic biopsy was performed of the larger of the 2 groups on 07/26/2017 which revealed atypical ductal hyperplasia.  She subsequently saw surgery and underwent a right lumpectomy which confirmed that there was no additional malignancy.  There was primarily atypical ductal hyperplasia.  She was referred to [place] for discussion regarding her high risk finding and whether or not she needs further risk reduction treatments or any additional surveillance beyond mammograms  I reviewed her records extensively and collaborated the history with the patient.   MEDICAL HISTORY:  Past Medical History:  Diagnosis Date  . Arthritis    knees  . GERD (gastroesophageal reflux disease)    had sleeve in 2015 and often needs zantac for digestion  . History of hiatal hernia   . Hypertension   . Morbid obesity (HCC)     SURGICAL HISTORY: Past Surgical History:  Procedure Laterality Date  . BREAST LUMPECTOMY WITH RADIOACTIVE SEED LOCALIZATION Right 08/31/2017   Procedure: RIGHT BREAST LUMPECTOMY WITH RADIOACTIVE SEED LOCALIZATION ERAS PATHWAY, TWO SEEDS;  Surgeon: Claud Kelp, MD;  Location: [facility];  Service: General;  Laterality: Right;  . CESAREAN SECTION  1980, 1984  . LAPAROSCOPIC GASTRIC BAND REMOVAL WITH LAPAROSCOPIC GASTRIC SLEEVE RESECTION     2015  . ROTATOR CUFF REPAIR  2008,  2011    SOCIAL HISTORY: Social History   Socioeconomic History  . Marital status: Married    Spouse name: Not on file  . Number of children: Not on file  . Years of education: Not on file  . Highest education level: Not on file  Occupational History  . Not on file  Social Needs  . Financial resource strain: Not on file  . Food insecurity:    Worry: Not on file    Inability: Not on file  . Transportation needs:    Medical: Not on file    Non-medical: Not on file  Tobacco Use  . Smoking status: Never Smoker  . Smokeless tobacco: Never Used  Substance and Sexual Activity  . Alcohol use: No  . Drug use: No  . Sexual activity: Not on file  Lifestyle  . Physical activity:    Days per week: Not on file    Minutes per session: Not on file  . Stress: Not on file  Relationships  . Social connections:    Talks on phone: Not on file    Gets together: Not on file    Attends religious service: Not on file    Active member of club or organization: Not on file    Attends meetings of clubs or organizations: Not on file    Relationship status: Not on file  . Intimate partner violence:    Fear of current or ex partner: Not on file    Emotionally abused: Not on file    Physically abused: Not on file    Forced sexual activity: Not on file  Other Topics Concern  . Not on file  Social History Narrative  . Not on file    FAMILY HISTORY: Family History  Problem Relation Age of Onset  . Breast cancer Maternal Aunt   . Breast cancer Cousin   . Heart attack Brother   . Prostate cancer Brother   . Kidney Stones Brother   . Diabetes Maternal Grandmother   . Heart disease Maternal Grandmother     ALLERGIES:  is allergic to sulfonamide derivatives and aspirin.  MEDICATIONS:  Current Outpatient Medications  Medication Sig Dispense Refill  . calcium carbonate (OSCAL) 1500 (600 Ca) MG TABS tablet Take by mouth daily with breakfast.    . Cholecalciferol (VITAMIN D3) 1000 UNITS CAPS  Take 1 capsule by mouth daily.    . cyclobenzaprine (FLEXERIL) 10 MG tablet Take 1 tablet (10 mg total) by mouth 3 (three) times daily as needed for muscle spasms. 30 tablet 2  . ferrous sulfate 325 (65 FE) MG EC tablet Take 325 mg by mouth daily with breakfast.    . fexofenadine (ALLEGRA) 180 MG tablet Take 180 mg by mouth daily.    Marland Kitchen HYDROcodone-acetaminophen (NORCO) 5-325 MG tablet Take 1-2 tablets by mouth every 6 (six) hours as needed for moderate pain or severe pain. 20 tablet 0  . meloxicam (MOBIC) 15 MG tablet TAKE ONE TABLET DAILY AS NEEDED. 90 tablet 0  . MICARDIS 80 MG tablet Take 80 mg by mouth daily.     . montelukast (SINGULAIR) 10 MG tablet Take 10 mg by mouth daily.     . Multiple Vitamin (MULTIVITAMIN) tablet Take 1 tablet by mouth daily.    . ranitidine (ZANTAC) 150 MG tablet Take 150 mg by mouth as needed for heartburn.    . rosuvastatin (CRESTOR) 10 MG tablet Take 5 mg by mouth at bedtime and may repeat dose one time if needed.    . vitamin B-12 (CYANOCOBALAMIN) 1000 MCG tablet Take 5,000 mcg by mouth daily.     No current facility-administered medications for this visit.     REVIEW OF SYSTEMS:   Constitutional: Denies fevers, chills or abnormal night sweats Eyes: Denies blurriness of vision, double vision or watery eyes Ears, nose, mouth, throat, and face: Denies mucositis or sore throat Respiratory: Denies cough, dyspnea or wheezes Cardiovascular: Denies palpitation, chest discomfort or lower extremity swelling Gastrointestinal:  Denies nausea, heartburn or change in bowel habits Skin: Denies abnormal skin rashes Lymphatics: Denies new lymphadenopathy or easy bruising Neurological:Denies numbness, tingling or new weaknesses Behavioral/Psych: Mood is stable, no new changes  Breast: Recent lumpectomy All other systems were reviewed with the patient and are negative.  PHYSICAL EXAMINATION: ECOG PERFORMANCE STATUS: 1 - Symptomatic but completely ambulatory  Vitals:    10/05/17 1256  BP: (!) 148/69  Pulse: 68  Resp: 18  Temp: 98.5 F (36.9 C)  SpO2: 99%   Filed Weights   10/05/17 1256  Weight: 265 lb 11.2 oz (120.5 kg)    GENERAL:alert, no distress and comfortable SKIN: skin color, texture, turgor are normal, no rashes or significant lesions EYES: normal, conjunctiva are pink and non-injected, sclera clear OROPHARYNX:no exudate, no erythema and lips, buccal mucosa, and tongue normal  NECK: supple, thyroid normal size, non-tender, without nodularity LYMPH:  no palpable lymphadenopathy in the cervical, axillary or inguinal LUNGS: clear to auscultation and percussion with normal breathing effort HEART: regular rate & rhythm and no murmurs and no lower extremity edema ABDOMEN:abdomen soft, non-tender and normal bowel sounds Musculoskeletal:no cyanosis of  digits and no clubbing  PSYCH: alert & oriented x 3 with fluent speech NEURO: no focal motor/sensory deficits   LABORATORY DATA:  I have reviewed the data as listed Lab Results  Component Value Date   WBC 7.2 08/30/2017   HGB 11.1 (L) 08/30/2017   HCT 35.3 (L) 08/30/2017   MCV 89.1 08/30/2017   PLT 199 08/30/2017   Lab Results  Component Value Date   NA 138 08/30/2017   K 4.5 08/30/2017   CL 105 08/30/2017   CO2 23 08/30/2017    RADIOGRAPHIC STUDIES: I have personally reviewed the radiological reports and agreed with the findings in the report.  ASSESSMENT AND PLAN:  Atypical ductal hyperplasia of right breast 08/31/2017: Right breast lumpectomy: Atypical ductal hyperplasia with associated microcalcifications Pathology review: I discussed the difference between atypical ductal hyperplasia, DCIS and invasive breast cancer. I explained to her that atypical ductal hyperplasia is characterized by a proliferation of uniform epithelial cells filling part, but not the entirety, of the involved duct. ADH is associated with an increased risk of both ipsilateral and contralateral breast  cancer and thus provides evidence of underlying breast abnormalities that predispose to breast cancer.   Prognosis:Using the American Cancer Society breast cancer risk assessment tool, her risk of breast cancer in 5 years is at 2.5% ( average woman's risk is 1.5%), her lifetime risk of breast cancers at 13.6% ( average risk is a 7.6%)  I discussed the risks and benefits of tamoxifen therapy. I believe that the risks far outweigh any benefits from tamoxifen. Instead I recommended the following 1. Exercise 30 minutes daily 2. Weight loss 3. Increasing fruits and vegetables and less red meat I believe all of the above would extensively decrease her risk of breast cancer as well as other cancers including cancers of the colon substantially.  Surveillance plan: Annual mammograms and breast exams Since the risk of breast cancer lifetime is less than 20% there is no role of breast MRIs. Patient will be followed by her primary care physician and surgery.  We will see her on an as-needed basis.   All questions were answered. The patient knows to call the clinic with any problems, questions or concerns.    Tamsen Meek, MD 10/05/17

## 2017-10-08 ENCOUNTER — Telehealth: Payer: Self-pay | Admitting: Hematology and Oncology

## 2017-10-08 NOTE — Telephone Encounter (Signed)
Spoke to patient regarding upcoming July appointments.  °

## 2017-12-16 ENCOUNTER — Telehealth: Payer: Self-pay | Admitting: Hematology and Oncology

## 2017-12-16 NOTE — Telephone Encounter (Signed)
Patient called to reschedule  °

## 2017-12-20 ENCOUNTER — Encounter: Payer: Federal, State, Local not specified - PPO | Admitting: Genetic Counselor

## 2017-12-20 ENCOUNTER — Other Ambulatory Visit: Payer: Federal, State, Local not specified - PPO

## 2018-01-11 ENCOUNTER — Telehealth: Payer: Self-pay

## 2018-01-11 NOTE — Telephone Encounter (Signed)
Per 8/19 voice message . Patient requested to cancel her appointment due to death in her family. Upon rescheduling this appointment I Marylene Land(Annica Marinello) sent Mick SellKaren P. A sch msg to make her aware of this canceled appointment also and r/s the appointment for 9/16 Clydie BraunKaren next available date.

## 2018-01-12 ENCOUNTER — Inpatient Hospital Stay: Payer: Federal, State, Local not specified - PPO

## 2018-01-12 ENCOUNTER — Inpatient Hospital Stay: Payer: Federal, State, Local not specified - PPO | Admitting: Genetic Counselor

## 2018-02-02 ENCOUNTER — Telehealth: Payer: Self-pay | Admitting: Nutrition

## 2018-02-02 NOTE — Telephone Encounter (Signed)
patient called to cancel does not want to be rescheduled

## 2018-02-07 ENCOUNTER — Other Ambulatory Visit: Payer: Federal, State, Local not specified - PPO

## 2018-02-07 ENCOUNTER — Encounter: Payer: Federal, State, Local not specified - PPO | Admitting: Genetic Counselor

## 2018-09-26 ENCOUNTER — Other Ambulatory Visit: Payer: Self-pay | Admitting: Obstetrics & Gynecology

## 2018-09-26 DIAGNOSIS — Z1231 Encounter for screening mammogram for malignant neoplasm of breast: Secondary | ICD-10-CM

## 2018-10-03 ENCOUNTER — Other Ambulatory Visit: Payer: Self-pay

## 2018-10-03 ENCOUNTER — Ambulatory Visit
Admission: RE | Admit: 2018-10-03 | Discharge: 2018-10-03 | Disposition: A | Discharge status: Home / Self Care | Payer: Federal, State, Local not specified - PPO | Source: Ambulatory Visit | Attending: Obstetrics & Gynecology | Admitting: Obstetrics & Gynecology

## 2018-10-03 DIAGNOSIS — Z1231 Encounter for screening mammogram for malignant neoplasm of breast: Secondary | ICD-10-CM

## 2018-10-06 ENCOUNTER — Other Ambulatory Visit: Payer: Self-pay | Admitting: Obstetrics & Gynecology

## 2018-10-06 DIAGNOSIS — R921 Mammographic calcification found on diagnostic imaging of breast: Secondary | ICD-10-CM

## 2018-10-13 ENCOUNTER — Other Ambulatory Visit: Payer: Self-pay | Admitting: Obstetrics & Gynecology

## 2018-10-13 ENCOUNTER — Ambulatory Visit
Admission: RE | Admit: 2018-10-13 | Discharge: 2018-10-13 | Disposition: A | Discharge status: Home / Self Care | Payer: Federal, State, Local not specified - PPO | Source: Ambulatory Visit | Attending: Obstetrics & Gynecology | Admitting: Obstetrics & Gynecology

## 2018-10-13 ENCOUNTER — Other Ambulatory Visit: Payer: Self-pay

## 2018-10-13 DIAGNOSIS — R921 Mammographic calcification found on diagnostic imaging of breast: Secondary | ICD-10-CM

## 2018-10-21 ENCOUNTER — Ambulatory Visit
Admission: RE | Admit: 2018-10-21 | Discharge: 2018-10-21 | Disposition: A | Discharge status: Home / Self Care | Payer: Federal, State, Local not specified - PPO | Source: Ambulatory Visit | Attending: Obstetrics & Gynecology | Admitting: Obstetrics & Gynecology

## 2018-10-21 ENCOUNTER — Other Ambulatory Visit: Payer: Self-pay

## 2018-10-21 DIAGNOSIS — R921 Mammographic calcification found on diagnostic imaging of breast: Secondary | ICD-10-CM

## 2018-11-14 ENCOUNTER — Other Ambulatory Visit: Payer: Self-pay | Admitting: General Surgery

## 2018-11-14 DIAGNOSIS — N6091 Unspecified benign mammary dysplasia of right breast: Secondary | ICD-10-CM

## 2018-11-15 ENCOUNTER — Other Ambulatory Visit: Payer: Self-pay | Admitting: General Surgery

## 2018-11-15 DIAGNOSIS — N6091 Unspecified benign mammary dysplasia of right breast: Secondary | ICD-10-CM

## 2018-12-06 NOTE — Pre-Procedure Instructions (Signed)
Amanda Thomas  12/06/2018      [id number] - [place], [place] [address] [place] [address] Phone: [phone] Fax: [phone]  [place], [place] - [address] [place] [address] Phone: [phone] Fax: [phone]    Your procedure is scheduled on Tues., December 13, 2018 from 7:30AM-8:30AM  Report to [facility] Entrance "A" at 5:30AM  Call this number if you have problems the morning of surgery:  [phone]   Remember:  Do not eat after midnight on July 20th  You may drink clear liquids until 3 hours (4:30AM) prior to surgery .  Clear liquids allowed are:  Water, Juice (non-citric and without pulp), Carbonated beverages, Clear Tea, Black Coffee only, Plain Jell-O only, Gatorade and Plain Popsicles only    Take these medicines the morning of surgery with A SIP OF WATER: Cetirizine (ZYRTEC)  If needed: Montelukast (SINGULAIR)  As of today, stop taking all Aspirin (unless instructed by your doctor) and Other Aspirin containing products, Vitamins, Fish oils, and Herbal medications. Also stop all NSAIDS i.e. Advil, Ibuprofen, Motrin, Aleve, Anaprox, Naproxen, BC, Goody Powders, and all Supplements. Including: Meloxicam [facility])   Special instructions:   [facility]- Preparing For Surgery  Before surgery, you can play an important role. Because skin is not sterile, your skin needs to be as free of germs as possible. You can reduce the number of germs on your skin by washing with CHG (chlorahexidine gluconate) Soap before surgery.  CHG is an antiseptic cleaner which kills germs and bonds with the skin to continue killing germs even after washing.    Please do not use if you have an allergy to CHG or antibacterial soaps. If your skin becomes reddened/irritated stop using the CHG.  Do not shave (including legs and underarms) for at least  48 hours prior to first CHG shower. It is OK to shave your face.  Please follow these instructions carefully.   1. Shower the NIGHT BEFORE SURGERY and the MORNING OF SURGERY with CHG.   2. If you chose to wash your hair, wash your hair first as usual with your normal shampoo.  3. After you shampoo, rinse your hair and body thoroughly to remove the shampoo.  4. Use CHG as you would any other liquid soap. You can apply CHG directly to the skin and wash gently with a scrungie or a clean washcloth.   5. Apply the CHG Soap to your body ONLY FROM THE NECK DOWN.  Do not use on open wounds or open sores. Avoid contact with your eyes, ears, mouth and genitals (private parts). Wash Face and genitals (private parts)  with your normal soap.  6. Wash thoroughly, paying special attention to the area where your surgery will be performed.  7. Thoroughly rinse your body with warm water from the neck down.  8. DO NOT shower/wash with your normal soap after using and rinsing off the CHG Soap.  9. Pat yourself dry with a CLEAN TOWEL.  10. Wear CLEAN PAJAMAS to bed the night before surgery, wear comfortable clothes the morning of surgery  11. Place CLEAN SHEETS on your bed the night of your first shower and DO NOT SLEEP WITH PETS.   Day of Surgery:  Oral Hygiene is also important to reduce your risk of infection.  Remember - BRUSH YOUR TEETH THE MORNING OF SURGERY WITH  YOUR REGULAR TOOTHPASTE   Do not wear jewelry, make-up or nail polish.  Do not wear lotions, powders, or perfumes, or deodorant.  Do not shave 48 hours prior to surgery.    Do not bring valuables to the hospital.  [facility] any belongings or valuables.  Contacts, dentures or bridgework may not be worn into surgery.   For patients admitted to the hospital, discharge time will be determined by your treatment team.  Patients discharged the day of surgery will not be allowed to drive home.   Please wear clean  clothes to the hospital/surgery center.    Please read over the following fact sheets that you were given. Pain Booklet, Coughing and Deep Breathing and Surgical Site Infection Prevention

## 2018-12-07 ENCOUNTER — Encounter (HOSPITAL_COMMUNITY)
Admission: RE | Admit: 2018-12-07 | Discharge: 2018-12-07 | Disposition: A | Discharge status: Home / Self Care | Payer: Federal, State, Local not specified - PPO | Source: Ambulatory Visit | Attending: General Surgery | Admitting: General Surgery

## 2018-12-07 ENCOUNTER — Encounter (HOSPITAL_COMMUNITY): Payer: Self-pay

## 2018-12-07 ENCOUNTER — Other Ambulatory Visit: Payer: Self-pay

## 2018-12-07 DIAGNOSIS — Z01818 Encounter for other preprocedural examination: Secondary | ICD-10-CM | POA: Diagnosis not present

## 2018-12-07 DIAGNOSIS — I10 Essential (primary) hypertension: Secondary | ICD-10-CM | POA: Insufficient documentation

## 2018-12-07 LAB — COMPREHENSIVE METABOLIC PANEL
ALT: 19 U/L (ref 0–44)
AST: 23 U/L (ref 15–41)
Albumin: 3.5 g/dL (ref 3.5–5.0)
Alkaline Phosphatase: 55 U/L (ref 38–126)
Anion gap: 8 (ref 5–15)
BUN: 20 mg/dL (ref 6–20)
CO2: 24 mmol/L (ref 22–32)
Calcium: 9 mg/dL (ref 8.9–10.3)
Chloride: 110 mmol/L (ref 98–111)
Creatinine, Ser: 0.82 mg/dL (ref 0.44–1.00)
GFR calc Af Amer: >60 mL/min (ref >60)
GFR calc non Af Amer: >60 mL/min (ref >60)
Glucose, Bld: 100 mg/dL — ABNORMAL HIGH (ref 70–99)
Potassium: 3.9 mmol/L (ref 3.5–5.1)
Sodium: 142 mmol/L (ref 135–145)
Total Bilirubin: 0.7 mg/dL (ref 0.3–1.2)
Total Protein: 7.2 g/dL (ref 6.5–8.1)

## 2018-12-07 LAB — CBC WITH DIFFERENTIAL/PLATELET
Abs Immature Granulocytes: 0.02 10*3/uL (ref 0.00–0.07)
Basophils Absolute: 0 10*3/uL (ref 0.0–0.1)
Basophils Relative: 0 %
Eosinophils Absolute: 0.2 10*3/uL (ref 0.0–0.5)
Eosinophils Relative: 3 %
HCT: 36.2 % (ref 36.0–46.0)
Hemoglobin: 11.4 g/dL — ABNORMAL LOW (ref 12.0–15.0)
Immature Granulocytes: 0 %
Lymphocytes Relative: 32 %
Lymphs Abs: 2.5 10*3/uL (ref 0.7–4.0)
MCH: 28.7 pg (ref 26.0–34.0)
MCHC: 31.5 g/dL (ref 30.0–36.0)
MCV: 91.2 fL (ref 80.0–100.0)
Monocytes Absolute: 0.4 10*3/uL (ref 0.1–1.0)
Monocytes Relative: 5 %
Neutro Abs: 4.8 10*3/uL (ref 1.7–7.7)
Neutrophils Relative %: 60 %
Platelets: 201 10*3/uL (ref 150–400)
RBC: 3.97 MIL/uL (ref 3.87–5.11)
RDW: 12.8 % (ref 11.5–15.5)
WBC: 7.9 10*3/uL (ref 4.0–10.5)
nRBC: 0 % (ref 0.0–0.2)

## 2018-12-07 NOTE — Progress Notes (Signed)
PCP - Roe Coombs  EKG - 12-07-18  Anesthesia review: N/A  Patient denies shortness of breath, fever, cough and chest pain at PAT appointment   Patient verbalized understanding of instructions that were given to them at the PAT appointment. Patient was also instructed that they will need to review over the PAT instructions again at home before surgery.

## 2018-12-08 NOTE — H&P (Signed)
Amanda Thomas Location: [facility] Patient #: [id number] DOB: 03/23/1961 Married / Language: English / Race: Black or African American Female       History of Present Illness  The patient is a 58 year old female who presents with a complaint of recurrent atypical ductal hyperplasia right breast. This is a very pleasant 58 year old female who comes back to me because of atypical ductal hyperplasia in the right breast. She has seen Dr. Lindi Adie in the high risk clinic a year ago. Amanda Marlin, PA provides primary care. Amanda Thomas dear also provides primary care. She was referred by Amanda Thomas at the [facility] because of recurrent ADH right breast, upper outer quadrant     On August 31, 2017 she underwent right breast lumpectomy and final pathology was atypical ductal hyperplasia. She saw Dr. Lindi Adie in the high risk clinic because of family history. He felt that risk of tamoxifen far outweighed any benefit. She was offered genetics but never did it because her husband had some medical problems at that time. She went back for screening mammograms recently and it showed 2 findings. Right breast upper inner quadrant shows some calcifications. Right breast upper outer quadrant shows some calcifications. Biopsy of the right breast upper outer quadrant showed ADH and flat epithelial atypia. Right breast core biopsy upper inner quadrant should completely benign adenosis and calcifications.      Family history reveals breast cancer in a maternal aunt and a maternal first cousin nex past history reveals BMI 65. DJD. GERD. Hiatal hernia. Hypertension. C-section 2. Sleeve gastrectomy at [facility] in 2015 she says she lost 100 pounds. 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 Married. 2 children. Denies alcohol tobacco.      We had a long discussion. We talked about family history, risks, genetics, MRI and so forth We agreed that the next step was to  simply perform a right breast lipectomy to see what we have I discussed the indications, techniques, and risk of the surgery in detail with her      We'll talk about genetics, high risk clinic referral, role of MRI after we get the final histology back.    Allergies  Sulfa 10 *OPHTHALMIC AGENTS*  Allergies Reconciled   Medication History  Vitamin D3 (Oral) Specific strength unknown - Active. Multi-Vitamin (Oral) Active. Meloxicam (15MG  Tablet, Oral prn) Active. Micardis (80MG  Tablet, Oral) Active. Singulair (10MG  Tablet, Oral prn) Active. Vitamin B-12 (Oral) Specific strength unknown - Active. Claritin (Oral prn) Specific strength unknown - Active. Rosuvastatin Calcium (5MG  Tablet, Oral) Active. Medications Reconciled  Vitals  Weight: 277.4 lb Height: 67in Body Surface Area: 2.32 m Body Mass Index: 43.45 kg/m  Temp.: 98.22F  Pulse: 104 (Regular)  BP: 146/84(Sitting, Left Arm, Standard)    Physical Exam  General Mental Status-Alert. General Appearance-Consistent with stated age. Hydration-Well hydrated. Voice-Normal. Note: BMI 43   Head and Neck Head-normocephalic, atraumatic with no lesions or palpable masses. Trachea-midline. Thyroid Gland Characteristics - normal size and consistency.  Eye Eyeball - Bilateral-Extraocular movements intact. Sclera/Conjunctiva - Bilateral-No scleral icterus.  Chest and Lung Exam Chest and lung exam reveals -quiet, even and easy respiratory effort with no use of accessory muscles and on auscultation, normal breath sounds, no adventitious sounds and normal vocal resonance. Inspection Chest Wall - Normal. Back - normal.  Breast Note: Breasts are moderately large but not hypertrophied. Curvilinear lumpectomy scar lateral right breast. Recent biopsy scar is well-healed. Perhaps a little bit of thickening superiorly from  the biopsies but no mass or hematoma. No other skin changes in either  breast. No axillary adenopathy.   Cardiovascular Cardiovascular examination reveals -normal heart sounds, regular rate and rhythm with no murmurs and normal pedal pulses bilaterally.  Abdomen Inspection Inspection of the abdomen reveals - No Hernias. Skin - Scar - Note: Healed Pfannenstiel incision. Trocar sites from her sleeve gastrectomy. All well-healed. Palpation/Percussion Palpation and Percussion of the abdomen reveal - Soft, Non Tender, No Rebound tenderness, No Rigidity (guarding) and No hepatosplenomegaly. Auscultation Auscultation of the abdomen reveals - Bowel sounds normal.  Neurologic Neurologic evaluation reveals -alert and oriented x 3 with no impairment of recent or remote memory. Mental Status-Normal.  Musculoskeletal Normal Exam - Left-Upper Extremity Strength Normal and Lower Extremity Strength Normal. Normal Exam - Right-Upper Extremity Strength Normal and Lower Extremity Strength Normal.  Lymphatic Head & Neck  General Head & Neck Lymphatics: Bilateral - Description - Normal. Axillary  General Axillary Region: Bilateral - Description - Normal. Tenderness - Non Tender. Femoral & Inguinal  Generalized Femoral & Inguinal Lymphatics: Bilateral - Description - Normal. Tenderness - Non Tender.    Assessment & Plan   ATYPICAL DUCTAL HYPERPLASIA OF RIGHT BREAST (N60.91)  Your annual screening mammograms recently showed an area of calcifications in the upper outer right breast, near the area of your previous biopsy. There was also an area of calcifications in the right breast upper inner quadrant The left breast looks fine Biopsy of the right breast upper outer quadrant, once again, shows atypical ductal hyperplasia Biopsy of the right breast upper inner quadrant is completely benign showing just benign adenosis and calcifications  We went over your previous biopsy last year and the evaluation in the high risk breast clinic. We talked about  genetics. you wanted to hold off on genetic counseling until we resolve the current problem and I agree  You know that atypical ductal hyperplasia has a 15% chance of being upgraded to breast cancer on excision Hopefully you do not have cancer  you'll be scheduled for right breast lumpectomy with radioactive seed localization I discussed the indications, techniques, and risk of the surgery with you in detail  After you recover from surgery we will talk about whether or not to get genetic testing, refer back to the high risk clinic for reevaluation, and the role of MRI in your care   FAMILY HISTORY OF BREAST CANCER (Z80.3)  BMI 40.0-44.9, ADULT (Z68.41)  HISTORY OF SLEEVE GASTRECTOMY (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 Derrell LollingIngram, M.D., [facility] General and Minimally invasive Surgery Breast and Colorectal Surgery Office:   [phone] Pager:   [phone]

## 2018-12-09 ENCOUNTER — Other Ambulatory Visit (HOSPITAL_COMMUNITY)
Admission: RE | Admit: 2018-12-09 | Discharge: 2018-12-09 | Disposition: A | Discharge status: Home / Self Care | Payer: Federal, State, Local not specified - PPO | Source: Ambulatory Visit | Attending: General Surgery | Admitting: General Surgery

## 2018-12-09 DIAGNOSIS — Z1159 Encounter for screening for other viral diseases: Secondary | ICD-10-CM | POA: Diagnosis present

## 2018-12-09 LAB — SARS CORONAVIRUS 2 (TAT 6-24 HRS): SARS Coronavirus 2: NEGATIVE

## 2018-12-12 ENCOUNTER — Other Ambulatory Visit: Payer: Self-pay

## 2018-12-12 ENCOUNTER — Ambulatory Visit
Admission: RE | Admit: 2018-12-12 | Discharge: 2018-12-12 | Disposition: A | Discharge status: Home / Self Care | Payer: Federal, State, Local not specified - PPO | Source: Ambulatory Visit | Attending: General Surgery | Admitting: General Surgery

## 2018-12-12 DIAGNOSIS — N6091 Unspecified benign mammary dysplasia of right breast: Secondary | ICD-10-CM

## 2018-12-12 MED ORDER — DEXTROSE 5 % IV SOLN
3.0000 g | INTRAVENOUS | Status: AC
  Administered 2018-12-13: 3 g via INTRAVENOUS
  Filled 2018-12-12: qty 3
  Filled 2018-12-12: qty 3000

## 2018-12-12 NOTE — Anesthesia Preprocedure Evaluation (Addendum)
Anesthesia Evaluation  Patient identified by MRN, date of birth, ID band Patient awake    Reviewed: Allergy & Precautions, H&P , NPO status , Patient's Chart, lab work & pertinent test results  Airway Mallampati: II  TM Distance: >3 FB Neck ROM: Full    Dental no notable dental hx. (+) Teeth Intact, Dental Advisory Given   Pulmonary neg pulmonary ROS,    Pulmonary exam normal breath sounds clear to auscultation       Cardiovascular Exercise Tolerance: Good hypertension, Pt. on medications  Rhythm:Regular Rate:Normal     Neuro/Psych negative neurological ROS  negative psych ROS   GI/Hepatic Neg liver ROS, hiatal hernia, GERD  Medicated and Controlled,  Endo/Other  Morbid obesity  Renal/GU negative Renal ROS  negative genitourinary   Musculoskeletal  (+) Arthritis , Osteoarthritis,    Abdominal   Peds  Hematology negative hematology ROS (+)   Anesthesia Other Findings   Reproductive/Obstetrics negative OB ROS                            Anesthesia Physical Anesthesia Plan  ASA: III  Anesthesia Plan: General   Post-op Pain Management:    Induction: Intravenous  PONV Risk Score and Plan: 4 or greater and Ondansetron, Dexamethasone and Midazolam  Airway Management Planned: Oral ETT  Additional Equipment:   Intra-op Plan:   Post-operative Plan: Extubation in OR  Informed Consent: I have reviewed the patients History and Physical, chart, labs and discussed the procedure including the risks, benefits and alternatives for the proposed anesthesia with the patient or authorized representative who has indicated his/her understanding and acceptance.     Dental advisory given  Plan Discussed with: CRNA  Anesthesia Plan Comments:         Anesthesia Quick Evaluation

## 2018-12-13 ENCOUNTER — Ambulatory Visit
Admission: RE | Admit: 2018-12-13 | Discharge: 2018-12-13 | Disposition: A | Discharge status: Home / Self Care | Payer: Federal, State, Local not specified - PPO | Source: Ambulatory Visit | Attending: General Surgery | Admitting: General Surgery

## 2018-12-13 ENCOUNTER — Ambulatory Visit (HOSPITAL_COMMUNITY): Payer: Federal, State, Local not specified - PPO | Admitting: Anesthesiology

## 2018-12-13 ENCOUNTER — Ambulatory Visit (HOSPITAL_COMMUNITY)
Admission: RE | Admit: 2018-12-13 | Discharge: 2018-12-13 | Disposition: A | Discharge status: Home / Self Care | Payer: Federal, State, Local not specified - PPO | Source: Intra-hospital | Attending: General Surgery | Admitting: General Surgery

## 2018-12-13 ENCOUNTER — Encounter (HOSPITAL_COMMUNITY)
Admission: RE | Disposition: A | Discharge status: Home / Self Care | Payer: Self-pay | Source: Intra-hospital | Attending: General Surgery

## 2018-12-13 ENCOUNTER — Encounter (HOSPITAL_COMMUNITY): Payer: Self-pay

## 2018-12-13 DIAGNOSIS — Z6841 Body Mass Index (BMI) 40.0 and over, adult: Secondary | ICD-10-CM | POA: Diagnosis not present

## 2018-12-13 DIAGNOSIS — K219 Gastro-esophageal reflux disease without esophagitis: Secondary | ICD-10-CM | POA: Diagnosis not present

## 2018-12-13 DIAGNOSIS — Z803 Family history of malignant neoplasm of breast: Secondary | ICD-10-CM | POA: Insufficient documentation

## 2018-12-13 DIAGNOSIS — I10 Essential (primary) hypertension: Secondary | ICD-10-CM | POA: Insufficient documentation

## 2018-12-13 DIAGNOSIS — Z79899 Other long term (current) drug therapy: Secondary | ICD-10-CM | POA: Insufficient documentation

## 2018-12-13 DIAGNOSIS — N6091 Unspecified benign mammary dysplasia of right breast: Secondary | ICD-10-CM

## 2018-12-13 DIAGNOSIS — Z9884 Bariatric surgery status: Secondary | ICD-10-CM | POA: Diagnosis not present

## 2018-12-13 HISTORY — PX: BREAST LUMPECTOMY WITH RADIOACTIVE SEED LOCALIZATION: SHX6424

## 2018-12-13 SURGERY — BREAST LUMPECTOMY WITH RADIOACTIVE SEED LOCALIZATION
Anesthesia: General | Site: Breast | Laterality: Right

## 2018-12-13 MED ORDER — LIDOCAINE 2% (20 MG/ML) 5 ML SYRINGE
INTRAMUSCULAR | Status: AC
  Filled 2018-12-13: qty 5

## 2018-12-13 MED ORDER — LIDOCAINE HCL (CARDIAC) PF 100 MG/5ML IV SOSY
PREFILLED_SYRINGE | INTRAVENOUS | Status: DC | PRN
  Administered 2018-12-13: 60 mg via INTRAVENOUS

## 2018-12-13 MED ORDER — FENTANYL CITRATE (PF) 250 MCG/5ML IJ SOLN
INTRAMUSCULAR | Status: DC | PRN
  Administered 2018-12-13: 50 ug via INTRAVENOUS
  Administered 2018-12-13: 100 ug via INTRAVENOUS

## 2018-12-13 MED ORDER — HYDROCODONE-ACETAMINOPHEN 5-325 MG PO TABS: 1.0000 | ORAL_TABLET | Freq: Four times a day (QID) | ORAL | 0 refills | Status: AC | PRN

## 2018-12-13 MED ORDER — MIDAZOLAM HCL 2 MG/2ML IJ SOLN
INTRAMUSCULAR | Status: DC | PRN
  Administered 2018-12-13: 2 mg via INTRAVENOUS

## 2018-12-13 MED ORDER — SUCCINYLCHOLINE CHLORIDE 200 MG/10ML IV SOSY
PREFILLED_SYRINGE | INTRAVENOUS | Status: DC | PRN
  Administered 2018-12-13: 100 mg via INTRAVENOUS

## 2018-12-13 MED ORDER — GABAPENTIN 300 MG PO CAPS
300.0000 mg | ORAL_CAPSULE | ORAL | Status: AC
  Administered 2018-12-13: 300 mg via ORAL
  Filled 2018-12-13: qty 1

## 2018-12-13 MED ORDER — MIDAZOLAM HCL 2 MG/2ML IJ SOLN
INTRAMUSCULAR | Status: AC
  Filled 2018-12-13: qty 2

## 2018-12-13 MED ORDER — HYDROCODONE-ACETAMINOPHEN 5-325 MG PO TABS
ORAL_TABLET | ORAL | Status: AC
  Filled 2018-12-13: qty 1

## 2018-12-13 MED ORDER — BUPIVACAINE-EPINEPHRINE (PF) 0.25% -1:200000 IJ SOLN
INTRAMUSCULAR | Status: AC
  Filled 2018-12-13: qty 30

## 2018-12-13 MED ORDER — BUPIVACAINE-EPINEPHRINE 0.25% -1:200000 IJ SOLN
INTRAMUSCULAR | Status: DC | PRN
  Administered 2018-12-13: 10 mL

## 2018-12-13 MED ORDER — PROPOFOL 10 MG/ML IV BOLUS
INTRAVENOUS | Status: AC
  Filled 2018-12-13: qty 40

## 2018-12-13 MED ORDER — FENTANYL CITRATE (PF) 250 MCG/5ML IJ SOLN
INTRAMUSCULAR | Status: AC
  Filled 2018-12-13: qty 5

## 2018-12-13 MED ORDER — 0.9 % SODIUM CHLORIDE (POUR BTL) OPTIME
TOPICAL | Status: DC | PRN
  Administered 2018-12-13: 1000 mL

## 2018-12-13 MED ORDER — HYDROMORPHONE HCL 1 MG/ML IJ SOLN: 0.2500 mg | INTRAMUSCULAR | Status: DC | PRN

## 2018-12-13 MED ORDER — CHLORHEXIDINE GLUCONATE CLOTH 2 % EX PADS: 6.0000 | MEDICATED_PAD | Freq: Once | CUTANEOUS | Status: DC

## 2018-12-13 MED ORDER — DEXAMETHASONE SODIUM PHOSPHATE 10 MG/ML IJ SOLN
INTRAMUSCULAR | Status: DC | PRN
  Administered 2018-12-13: 8 mg via INTRAVENOUS

## 2018-12-13 MED ORDER — ROCURONIUM BROMIDE 10 MG/ML (PF) SYRINGE
PREFILLED_SYRINGE | INTRAVENOUS | Status: AC
  Filled 2018-12-13: qty 10

## 2018-12-13 MED ORDER — EPHEDRINE SULFATE-NACL 50-0.9 MG/10ML-% IV SOSY
PREFILLED_SYRINGE | INTRAVENOUS | Status: DC | PRN
  Administered 2018-12-13 (×2): 5 mg via INTRAVENOUS
  Administered 2018-12-13 (×3): 7.5 mg via INTRAVENOUS
  Administered 2018-12-13 (×2): 5 mg via INTRAVENOUS

## 2018-12-13 MED ORDER — SODIUM CHLORIDE 0.9% FLUSH: 3.0000 mL | Freq: Two times a day (BID) | INTRAVENOUS | Status: DC

## 2018-12-13 MED ORDER — EPHEDRINE 5 MG/ML INJ
INTRAVENOUS | Status: AC
  Filled 2018-12-13: qty 10

## 2018-12-13 MED ORDER — ACETAMINOPHEN 500 MG PO TABS
1000.0000 mg | ORAL_TABLET | ORAL | Status: AC
  Administered 2018-12-13: 1000 mg via ORAL
  Filled 2018-12-13: qty 2

## 2018-12-13 MED ORDER — PROPOFOL 10 MG/ML IV BOLUS
INTRAVENOUS | Status: DC | PRN
  Administered 2018-12-13: 150 mg via INTRAVENOUS

## 2018-12-13 MED ORDER — HYDROCODONE-ACETAMINOPHEN 5-325 MG PO TABS
1.0000 | ORAL_TABLET | Freq: Once | ORAL | Status: AC
  Administered 2018-12-13: 10:00:00 1 via ORAL

## 2018-12-13 MED ORDER — LACTATED RINGERS IV SOLN
INTRAVENOUS | Status: DC
  Administered 2018-12-13 (×2): via INTRAVENOUS

## 2018-12-13 MED ORDER — PHENYLEPHRINE 40 MCG/ML (10ML) SYRINGE FOR IV PUSH (FOR BLOOD PRESSURE SUPPORT)
PREFILLED_SYRINGE | INTRAVENOUS | Status: AC
  Filled 2018-12-13: qty 10

## 2018-12-13 MED ORDER — PHENYLEPHRINE 40 MCG/ML (10ML) SYRINGE FOR IV PUSH (FOR BLOOD PRESSURE SUPPORT)
PREFILLED_SYRINGE | INTRAVENOUS | Status: DC | PRN
  Administered 2018-12-13: 100 ug via INTRAVENOUS
  Administered 2018-12-13: 80 ug via INTRAVENOUS
  Administered 2018-12-13: 120 ug via INTRAVENOUS
  Administered 2018-12-13: 100 ug via INTRAVENOUS

## 2018-12-13 MED ORDER — ONDANSETRON HCL 4 MG/2ML IJ SOLN
INTRAMUSCULAR | Status: DC | PRN
  Administered 2018-12-13: 4 mg via INTRAVENOUS

## 2018-12-13 MED ORDER — SUCCINYLCHOLINE CHLORIDE 200 MG/10ML IV SOSY
PREFILLED_SYRINGE | INTRAVENOUS | Status: AC
  Filled 2018-12-13: qty 10

## 2018-12-13 SURGICAL SUPPLY — 46 items
ADH SKN CLS APL DERMABOND .7 (GAUZE/BANDAGES/DRESSINGS) ×1
APL PRP STRL LF DISP 70% ISPRP (MISCELLANEOUS) ×1
APPLIER CLIP 9.375 MED OPEN (MISCELLANEOUS) ×3
APPLIER CLIP 9.375 SM OPEN (CLIP) ×3
APR CLP MED 9.3 20 MLT OPN (MISCELLANEOUS) ×1
APR CLP SM 9.3 20 MLT OPN (CLIP) ×1
BINDER BREAST LRG (GAUZE/BANDAGES/DRESSINGS) IMPLANT
BINDER BREAST XLRG (GAUZE/BANDAGES/DRESSINGS) IMPLANT
BINDER BREAST XXLRG (GAUZE/BANDAGES/DRESSINGS) ×2 IMPLANT
CANISTER SUCT 3000ML PPV (MISCELLANEOUS) ×3 IMPLANT
CHLORAPREP W/TINT 26 (MISCELLANEOUS) ×3 IMPLANT
CLIP APPLIE 9.375 MED OPEN (MISCELLANEOUS) ×1 IMPLANT
CLIP APPLIE 9.375 SM OPEN (CLIP) IMPLANT
COVER PROBE W GEL 5X96 (DRAPES) ×3 IMPLANT
COVER SURGICAL LIGHT HANDLE (MISCELLANEOUS) ×3 IMPLANT
COVER WAND RF STERILE (DRAPES) ×3 IMPLANT
DERMABOND ADVANCED (GAUZE/BANDAGES/DRESSINGS) ×2
DERMABOND ADVANCED .7 DNX12 (GAUZE/BANDAGES/DRESSINGS) ×1 IMPLANT
DEVICE DUBIN SPECIMEN MAMMOGRA (MISCELLANEOUS) ×3 IMPLANT
DRAPE CHEST BREAST 15X10 FENES (DRAPES) ×3 IMPLANT
DRSG PAD ABDOMINAL 8X10 ST (GAUZE/BANDAGES/DRESSINGS) ×3 IMPLANT
ELECT CAUTERY BLADE 6.4 (BLADE) ×3 IMPLANT
ELECT REM PT RETURN 9FT ADLT (ELECTROSURGICAL) ×3
ELECTRODE REM PT RTRN 9FT ADLT (ELECTROSURGICAL) ×1 IMPLANT
GAUZE SPONGE 4X4 12PLY STRL LF (GAUZE/BANDAGES/DRESSINGS) ×3 IMPLANT
GLOVE EUDERMIC 7 POWDERFREE (GLOVE) ×6 IMPLANT
GOWN STRL REUS W/ TWL LRG LVL3 (GOWN DISPOSABLE) ×1 IMPLANT
GOWN STRL REUS W/ TWL XL LVL3 (GOWN DISPOSABLE) ×1 IMPLANT
GOWN STRL REUS W/TWL LRG LVL3 (GOWN DISPOSABLE) ×3
GOWN STRL REUS W/TWL XL LVL3 (GOWN DISPOSABLE) ×3
ILLUMINATOR WAVEGUIDE N/F (MISCELLANEOUS) IMPLANT
KIT BASIN OR (CUSTOM PROCEDURE TRAY) ×3 IMPLANT
KIT MARKER MARGIN INK (KITS) ×3 IMPLANT
LIGHT WAVEGUIDE WIDE FLAT (MISCELLANEOUS) IMPLANT
NDL HYPO 25GX1X1/2 BEV (NEEDLE) ×1 IMPLANT
NEEDLE HYPO 25GX1X1/2 BEV (NEEDLE) ×3 IMPLANT
NS IRRIG 1000ML POUR BTL (IV SOLUTION) ×3 IMPLANT
PACK GENERAL/GYN (CUSTOM PROCEDURE TRAY) ×3 IMPLANT
PAD ABD 8X10 STRL (GAUZE/BANDAGES/DRESSINGS) ×2 IMPLANT
SPONGE LAP 4X18 RFD (DISPOSABLE) ×3 IMPLANT
SUT MNCRL AB 4-0 PS2 18 (SUTURE) ×3 IMPLANT
SUT SILK 2 0 SH (SUTURE) ×3 IMPLANT
SUT VIC AB 3-0 SH 18 (SUTURE) ×3 IMPLANT
SYR CONTROL 10ML LL (SYRINGE) ×3 IMPLANT
TOWEL GREEN STERILE (TOWEL DISPOSABLE) ×3 IMPLANT
TOWEL GREEN STERILE FF (TOWEL DISPOSABLE) ×3 IMPLANT

## 2018-12-13 NOTE — Anesthesia Postprocedure Evaluation (Signed)
Anesthesia Post Note  Patient: Amanda Thomas  Procedure(s) Performed: RIGHT BREAST LUMPECTOMY WITH RADIOACTIVE SEED LOCALIZATION (Right Breast)     Patient location during evaluation: PACU Anesthesia Type: General Level of consciousness: awake and alert Pain management: pain level controlled Vital Signs Assessment: post-procedure vital signs reviewed and stable Respiratory status: spontaneous breathing, nonlabored ventilation and respiratory function stable Cardiovascular status: blood pressure returned to baseline and stable Postop Assessment: no apparent nausea or vomiting Anesthetic complications: no    Last Vitals:  Vitals:   12/13/18 0930 12/13/18 0945  BP:    Pulse: 66 69  Resp: (!) 21 (!) 22  Temp:    SpO2: 95% 95%    Last Pain:  Vitals:   12/13/18 0948  PainSc: 3                  Sparrow Sanzo,W. EDMOND

## 2018-12-13 NOTE — Op Note (Signed)
Patient Name:           Amanda Thomas   Date of Surgery:        12/13/2018  Pre op Diagnosis:   Recurrent atypical ductal hyperplasia right breast     Post op Diagnosis:    Same  Procedure:                 Right breast lumpectomy with radioactive seed localization and margin assessment  Surgeon:                     Edsel Petrin. Dalbert Batman, M.D., FACS  Assistant:                      OR staff  Operative Indications:   This is a very pleasant 58 year old female who comes back to me because of atypical ductal hyperplasia in the right breast. She has seen Dr. Lindi Adie in the high risk clinic a year ago. [name], PA provides primary care. Linda Hedges DO also provides primary care. She was referred by [name] at the [facility] because of recurrent ADH right breast, upper outer quadrant     On August 31, 2017 she underwent right breast lumpectomy and final pathology was atypical ductal hyperplasia. She saw Dr. Lindi Adie in the high risk clinic because of family history. He felt that risk of tamoxifen far outweighed any benefit. She was offered genetics but never did it because her husband had some medical problems at that time. She went back for screening mammograms recently and it showed 2 findings. Right breast upper inner quadrant shows some calcifications. Right breast upper outer quadrant shows some calcifications. Biopsy of the right breast upper outer quadrant showed ADH and flat epithelial atypia. Right breast core biopsy upper inner quadrant should completely benign adenosis and calcifications.      Family history reveals breast cancer in a maternal aunt and a maternal first cousin  past history reveals BMI 74. DJD. GERD. Hiatal hernia. Hypertension. C-section 2. Sleeve gastrectomy at [facility] in 2015 she says she lost 100 pounds.. Denies alcohol tobacco.      We had a long discussion.   We talked about family history, risks, genetics, MRI and so forth We agreed that the next step  was to simply perform a right breast lipectomy to see what we have I discussed the indications, techniques, and risk of the surgery in detail with her      We'll talk about genetics, high risk clinic referral, role of MRI after we get the final histology back.   Operative Findings:      I was able to perform the right breast lumpectomy in the upper outer quadrant through the old lumpectomy incision.  Preop images and specimen mammogram showed the radioactive seed in the proper location and there was at least 3 metal marker clips marking the lumpectomy cavity, placed at the previous surgery.  Radiology felt that way we had removed the targeted area.  Procedure in Detail:          Following the induction of general LMA anesthesia the patient's right breast was prepped and draped in a sterile fashion.  Surgical timeout was performed.  Intravenous antibiotics were given.  0.5% Marcaine with epinephrine was used as a local infiltration anesthetic.  A curvilinear incision was made in the upper outer quadrant of the right breast through the old scar.  Using the neoprobe and electrocautery the lumpectomy was performed.  The  specimen was removed and marked with silk sutures and a 6 color ink kit.  Specimen mammogram looked good as described above.  The specimen was sent to the lab where the seed was retrieved.  Hemostasis was excellent.  The wound was irrigated.  I placed 5 small metal marker clips in the lumpectomy cavity walls.  The lumpectomy cavity was closed in layers with interrupted 3-0 Vicryl and the skin was closed with a running subcuticular 4-0 Monocryl and Dermabond.  Breast binder was placed and the patient taken to PACU in stable condition.  EBL 10 to 20 cc.  Counts correct.  Complications none.    Addendum: I logged onto the PMP aware website and reviewed her prescription medication     Liza Czerwinski M. Dalbert Batman, M.D., FACS General and Minimally Invasive Surgery Breast and Colorectal  Surgery  12/13/2018 8:26 AM

## 2018-12-13 NOTE — Anesthesia Procedure Notes (Signed)
Procedure Name: Intubation Date/Time: 12/13/2018 7:33 AM Performed by: Raenette Rover, CRNA Pre-anesthesia Checklist: Patient identified, Emergency Drugs available, Suction available and Patient being monitored Patient Re-evaluated:Patient Re-evaluated prior to induction Oxygen Delivery Method: Circle system utilized Preoxygenation: Pre-oxygenation with 100% oxygen Induction Type: IV induction and Rapid sequence Laryngoscope Size: Mac and 3 Grade View: Grade I Tube type: Oral Tube size: 7.0 mm Number of attempts: 1 Airway Equipment and Method: Stylet Placement Confirmation: ETT inserted through vocal cords under direct vision,  positive ETCO2,  CO2 detector and breath sounds checked- equal and bilateral Secured at: 22 cm Tube secured with: Tape Dental Injury: Teeth and Oropharynx as per pre-operative assessment

## 2018-12-13 NOTE — Interval H&P Note (Signed)
History and Physical Interval Note:  12/13/2018 5:35 AM  Amanda Thomas  has presented today for surgery, with the diagnosis of RIGHT BREAST ATYPICAL DUCTAL HYPERPLASIA.  The various methods of treatment have been discussed with the patient and family. After consideration of risks, benefits and other options for treatment, the patient has consented to  Procedure(s): RIGHT BREAST LUMPECTOMY WITH RADIOACTIVE SEED LOCALIZATION (Right) as a surgical intervention.  The patient's history has been reviewed, patient examined, no change in status, stable for surgery.  I have reviewed the patient's chart and labs.  Questions were answered to the patient's satisfaction.     Adin Hector

## 2018-12-13 NOTE — Transfer of Care (Signed)
Immediate Anesthesia Transfer of Care Note  Patient: Amanda Thomas  Procedure(s) Performed: RIGHT BREAST LUMPECTOMY WITH RADIOACTIVE SEED LOCALIZATION (Right Breast)  Patient Location: PACU  Anesthesia Type:General  Level of Consciousness: awake, alert , oriented, drowsy and patient cooperative  Airway & Oxygen Therapy: Patient Spontanous Breathing and Patient connected to face mask oxygen  Post-op Assessment: Report given to RN and Post -op Vital signs reviewed and stable  Post vital signs: Reviewed and stable  Last Vitals:  Vitals Value Taken Time  BP    Temp    Pulse    Resp    SpO2      Last Pain:  Vitals:   12/13/18 0627  PainSc: 0-No pain      Patients Stated Pain Goal: 2 (09/32/35 5732)  Complications: No apparent anesthesia complications

## 2018-12-13 NOTE — Discharge Instructions (Signed)
[facility] °Office Phone Number [phone] ° °BREAST BIOPSY/ PARTIAL MASTECTOMY: POST OP INSTRUCTIONS ° °Always review your discharge instruction sheet given to you by the facility where your surgery was performed. ° °IF YOU HAVE DISABILITY OR FAMILY LEAVE FORMS, YOU MUST BRING THEM TO THE OFFICE FOR PROCESSING.  DO NOT GIVE THEM TO YOUR DOCTOR. ° °1. A prescription for pain medication may be given to you upon discharge.  Take your pain medication as prescribed, if needed.  If narcotic pain medicine is not needed, then you may take acetaminophen (Tylenol) or ibuprofen (Advil) as needed. °2. Take your usually prescribed medications unless otherwise directed °3. If you need a refill on your pain medication, please contact your pharmacy.  They will contact our office to request authorization.  Prescriptions will not be filled after 5pm or on week-ends. °4. You should eat very light the first 24 hours after surgery, such as soup, crackers, pudding, etc.  Resume your normal diet the day after surgery. °5. Most patients will experience some swelling and bruising in the breast.  Ice packs and a good support bra will help.  Swelling and bruising can take several days to resolve.  °6. It is common to experience some constipation if taking pain medication after surgery.  Increasing fluid intake and taking a stool softener will usually help or prevent this problem from occurring.  A mild laxative (Milk of Magnesia or Miralax) should be taken according to package directions if there are no bowel movements after 48 hours. °7. Unless discharge instructions indicate otherwise, you may remove your bandages 24-48 hours after surgery, and you may shower at that time.  You may have steri-strips (small skin tapes) in place directly over the incision.  These strips should be left on the skin for 7-10 days.  If your surgeon used skin glue on the incision, you may shower in 24 hours.  The glue will flake off over the  next 2-3 weeks.  Any sutures or staples will be removed at the office during your follow-up visit. °8. ACTIVITIES:  You may resume regular daily activities (gradually increasing) beginning the next day.  Wearing a good support bra or sports bra minimizes pain and swelling.  You may have sexual intercourse when it is comfortable. °a. You may drive when you no longer are taking prescription pain medication, you can comfortably wear a seatbelt, and you can safely maneuver your car and apply brakes. °b. RETURN TO WORK:  ______________________________________________________________________________________ °9. You should see your doctor in the office for a follow-up appointment approximately two weeks after your surgery.  Your doctor’s nurse will typically make your follow-up appointment when she calls you with your pathology report.  Expect your pathology report 2-3 business days after your surgery.  You may call to check if you do not hear from us after three days. °10. OTHER INSTRUCTIONS: _______________________________________________________________________________________________ _____________________________________________________________________________________________________________________________________ °_____________________________________________________________________________________________________________________________________ °_____________________________________________________________________________________________________________________________________ ° °WHEN TO CALL YOUR DOCTOR: °1. Fever over 101.0 °2. Nausea and/or vomiting. °3. Extreme swelling or bruising. °4. Continued bleeding from incision. °5. Increased pain, redness, or drainage from the incision. ° °The clinic staff is available to answer your questions during regular business hours.  Please don’t hesitate to call and ask to speak to one of the nurses for clinical concerns.  If you have a medical emergency, go to the nearest  emergency room or call 911.  A surgeon from [facility] is always on call at the hospital. ° °For further questions, please visit [email or web address]  ° ° ° ° ° ° ° ° ° ° °••••••••• ° ° °  Managing Your Pain After Surgery Without Opioids ° ° ° °Thank you for participating in our program to help patients manage their pain after surgery without opioids. This is part of our effort to provide you with the best care possible, without exposing you or your family to the risk that opioids pose. ° °What pain can I expect after surgery? °You can expect to have some pain after surgery. This is normal. The pain is typically worse the day after surgery, and quickly begins to get better. °Many studies have found that many patients are able to manage their pain after surgery with Over-the-Counter (OTC) medications such as Tylenol and Motrin. If you have a condition that does not allow you to take Tylenol or Motrin, notify your surgical team. ° °How will I manage my pain? °The best strategy for controlling your pain after surgery is around the clock pain control with Tylenol (acetaminophen) and Motrin (ibuprofen or Advil). Alternating these medications with each other allows you to maximize your pain control. In addition to Tylenol and Motrin, you can use heating pads or ice packs on your incisions to help reduce your pain. ° °How will I alternate your regular strength over-the-counter pain medication? °You will take a dose of pain medication every three hours. °; Start by taking 650 mg of Tylenol (2 pills of 325 mg) °; 3 hours later take 600 mg of Motrin (3 pills of 200 mg) °; 3 hours after taking the Motrin take 650 mg of Tylenol °; 3 hours after that take 600 mg of Motrin. ° ° °- 1 - ° °See example - if your first dose of Tylenol is at 12:00 PM ° ° °12:00 PM Tylenol 650 mg (2 pills of 325 mg)  °3:00 PM Motrin 600 mg (3 pills of 200 mg)  °6:00 PM Tylenol 650 mg (2 pills of 325 mg)  °9:00 PM Motrin 600 mg (3  pills of 200 mg)  °Continue alternating every 3 hours  ° °We recommend that you follow this schedule around-the-clock for at least 3 days after surgery, or until you feel that it is no longer needed. Use the table on the last page of this handout to keep track of the medications you are taking. °Important: °Do not take more than 3000mg of Tylenol or 3200mg of Motrin in a 24-hour period. °Do not take ibuprofen/Motrin if you have a history of bleeding stomach ulcers, severe kidney disease, &/or actively taking a blood thinner ° °What if I still have pain? °If you have pain that is not controlled with the over-the-counter pain medications (Tylenol and Motrin or Advil) you might have what we call “breakthrough” pain. You will receive a prescription for a small amount of an opioid pain medication such as Oxycodone, Tramadol, or Tylenol with Codeine. Use these opioid pills in the first 24 hours after surgery if you have breakthrough pain. Do not take more than 1 pill every 4-6 hours. ° °If you still have uncontrolled pain after using all opioid pills, don't hesitate to call our staff using the number provided. We will help make sure you are managing your pain in the best way possible, and if necessary, we can provide a prescription for additional pain medication. ° ° °Day 1   ° °Time  °Name of Medication Number of pills taken  °Amount of Acetaminophen  °Pain Level  ° °Comments  °AM PM       °AM PM       °AM PM       °  AM PM       °AM PM       °AM PM       °AM PM       °AM PM       °Total Daily amount of Acetaminophen °Do not take more than  3,000 mg per day    ° ° °Day 2   ° °Time  °Name of Medication Number of pills °taken  °Amount of Acetaminophen  °Pain Level  ° °Comments  °AM PM       °AM PM       °AM PM       °AM PM       °AM PM       °AM PM       °AM PM       °AM PM       °Total Daily amount of Acetaminophen °Do not take more than  3,000 mg per day    ° ° °Day 3   ° °Time  °Name of Medication Number of pills taken    °Amount of Acetaminophen  °Pain Level  ° °Comments  °AM PM       °AM PM       °AM PM       °AM PM       ° ° ° °AM PM       °AM PM       °AM PM       °AM PM       °Total Daily amount of Acetaminophen °Do not take more than  3,000 mg per day    ° ° °Day 4   ° °Time  °Name of Medication Number of pills taken  °Amount of Acetaminophen  °Pain Level  ° °Comments  °AM PM       °AM PM       °AM PM       °AM PM       °AM PM       °AM PM       °AM PM       °AM PM       °Total Daily amount of Acetaminophen °Do not take more than  3,000 mg per day    ° ° °Day 5   ° °Time  °Name of Medication Number °of pills taken  °Amount of Acetaminophen  °Pain Level  ° °Comments  °AM PM       °AM PM       °AM PM       °AM PM       °AM PM       °AM PM       °AM PM       °AM PM       °Total Daily amount of Acetaminophen °Do not take more than  3,000 mg per day    ° ° ° °Day 6   ° °Time  °Name of Medication Number of pills °taken  °Amount of Acetaminophen  °Pain Level  °Comments  °AM PM       °AM PM       °AM PM       °AM PM       °AM PM       °AM PM       °AM PM       °AM PM       °Total Daily amount of Acetaminophen °Do not take more than    3,000 mg per day    ° ° °Day 7   ° °Time  °Name of Medication Number of pills taken  °Amount of Acetaminophen  °Pain Level  ° °Comments  °AM PM       °AM PM       °AM PM       °AM PM       °AM PM       °AM PM       °AM PM       °AM PM       °Total Daily amount of Acetaminophen °Do not take more than  3,000 mg per day    ° ° ° ° °For additional information about how and where to safely dispose of unused opioid °medications - https://www.morepowerfulnc.org ° °Disclaimer: This document contains information and/or instructional materials adapted from [facility] for the typical patient with your condition. It does not replace medical advice from your health care provider because your experience may differ from that of the °typical patient. Talk to your health care provider if you have any questions about  this °document, your condition or your treatment plan. °Adapted from [facility] ° °

## 2018-12-14 ENCOUNTER — Encounter (HOSPITAL_COMMUNITY): Payer: Self-pay | Admitting: General Surgery

## 2018-12-15 NOTE — Progress Notes (Signed)
Inform patient of Pathology report,. Breast pathology shows benign fibrocystic changes and scarring There was no evidence of malignancy I will discuss this with her in detail at her next office visit Let me know that you contacted her  Amanda Thomas

## 2019-01-31 DIAGNOSIS — K219 Gastro-esophageal reflux disease without esophagitis: Secondary | ICD-10-CM | POA: Insufficient documentation

## 2019-04-18 ENCOUNTER — Other Ambulatory Visit: Payer: Self-pay

## 2019-04-18 ENCOUNTER — Encounter: Payer: Self-pay | Admitting: Podiatry

## 2019-04-18 ENCOUNTER — Ambulatory Visit: Payer: Federal, State, Local not specified - PPO | Admitting: Podiatry

## 2019-04-18 VITALS — BP 133/65 | HR 74 | Resp 16

## 2019-04-18 DIAGNOSIS — L600 Ingrowing nail: Secondary | ICD-10-CM

## 2019-04-18 DIAGNOSIS — N951 Menopausal and female climacteric states: Secondary | ICD-10-CM | POA: Insufficient documentation

## 2019-04-18 MED ORDER — CIPROFLOXACIN-DEXAMETHASONE 0.3-0.1 % OT SUSP: OTIC | 2 refills | Status: AC

## 2019-04-18 NOTE — Patient Instructions (Signed)

## 2019-04-18 NOTE — Progress Notes (Signed)
Subjective:  Patient ID: Amanda Thomas, female    DOB: 21-Jul-1960,  MRN: [id number] HPI Chief Complaint  Patient presents with  . Toe Pain    Hallux right - medial border, ingrown x 3 weeks, red, swollen, draining, soaking and keeping clean  . New Patient (Initial Visit)    58 y.o. female presents with the above complaint.   ROS: Denies fever chills nausea vomiting muscle aches pains calf pain back pain chest pain shortness of breath.  Past Medical History:  Diagnosis Date  . Arthritis    knees  . GERD (gastroesophageal reflux disease)    had sleeve in 2015 and often needs zantac for digestion  . History of hiatal hernia   . Hypertension   . Morbid obesity ([place])    Past Surgical History:  Procedure Laterality Date  . BREAST EXCISIONAL BIOPSY Right 2019  . BREAST LUMPECTOMY WITH RADIOACTIVE SEED LOCALIZATION Right 08/31/2017   Procedure: RIGHT BREAST LUMPECTOMY WITH RADIOACTIVE SEED LOCALIZATION ERAS PATHWAY, TWO SEEDS;  Surgeon: Fanny Skates, MD;  Location: [facility];  Service: General;  Laterality: Right;  . BREAST LUMPECTOMY WITH RADIOACTIVE SEED LOCALIZATION Right 12/13/2018   Procedure: RIGHT BREAST LUMPECTOMY WITH RADIOACTIVE SEED LOCALIZATION;  Surgeon: Fanny Skates, MD;  Location: [facility];  Service: General;  Laterality: Right;  . [place]  . LAPAROSCOPIC GASTRIC BAND REMOVAL WITH LAPAROSCOPIC GASTRIC SLEEVE RESECTION     2015  . ROTATOR CUFF REPAIR  2008, 2011    Current Outpatient Medications:  .  Calcium Carbonate-Vitamin D3 (CALCIUM 600-D) 600-400 MG-UNIT TABS, Take 1 tablet by mouth daily., Disp: , Rfl:  .  cetirizine (ZYRTEC) 10 MG tablet, Take 10 mg by mouth daily., Disp: , Rfl:  .  Cholecalciferol (VITAMIN D3) 50 MCG (2000 UT) capsule, Take 2,000 Units by mouth daily. , Disp: , Rfl:  .  ciprofloxacin-dexamethasone (CIPRODEX) OTIC suspension, Apply 1-2 drops to toe after soaking BIC, Disp: 7.5 mL, Rfl: 2 .  Cyanocobalamin  (VITAMIN B-12 PO), Take 5,000 mcg by mouth daily. , Disp: , Rfl:  .  ferrous sulfate 325 (65 FE) MG EC tablet, Take 325 mg by mouth daily with breakfast., Disp: , Rfl:  .  fluticasone (FLONASE) 50 MCG/ACT nasal spray, Place 1 spray into both nostrils daily., Disp: , Rfl:  .  HYDROcodone-acetaminophen (NORCO) 5-325 MG tablet, Take 1-2 tablets by mouth every 6 (six) hours as needed for moderate pain or severe pain., Disp: 20 tablet, Rfl: 0 .  meloxicam (MOBIC) 7.5 MG tablet, Take 7.5 mg by mouth daily as needed for pain., Disp: , Rfl:  .  MICARDIS 80 MG tablet, Take 80 mg by mouth daily. , Disp: , Rfl:  .  montelukast (SINGULAIR) 10 MG tablet, Take 10 mg by mouth daily as needed (allergies). , Disp: , Rfl:  .  Multiple Vitamin (MULTIVITAMIN) tablet, Take 1 tablet by mouth daily., Disp: , Rfl:  .  omeprazole (PRILOSEC) 10 MG capsule, Take 10 mg by mouth daily as needed., Disp: , Rfl:  .  rosuvastatin (CRESTOR) 5 MG tablet, Take 5 mg by mouth at bedtime. , Disp: , Rfl:  .  vitamin C (ASCORBIC ACID) 500 MG tablet, Take 500 mg by mouth daily., Disp: , Rfl:   Allergies  Allergen Reactions  . Black Walnut Pollen Allergy Skin Test Itching and Shortness Of Breath    Other reaction(s): Eye Swelling, Facial Swelling  . Cabbage Itching and Swelling    Raw   .  Other Anaphylaxis, Shortness Of Breath and Cough    Walnuts leads to cough,swelling,and difficulties breathing   . Sulfonamide Derivatives Shortness Of Breath  . Sulfur Dioxide Shortness Of Breath  . Aspirin Other (See Comments)    REACTION: GI Upset, pt states she has taken ASA without issues 10 years ago   Review of Systems Objective:   Vitals:   04/18/19 1028  BP: 133/65  Pulse: 74  Resp: 16    General: Well developed, nourished, in no acute distress, alert and oriented x3   Dermatological: Skin is warm, dry and supple bilateral. Nails x 10 are well maintained; remaining integument appears unremarkable at this time. There are no  open sores, no preulcerative lesions, no rash or signs of infection present.  Sharp incurvated toenail along the tibiofibular border of the hallux right gross granulation tissue purulence malodor tibial border hallux right.  Vascular: Dorsalis Pedis artery and Posterior Tibial artery pedal pulses are 2/4 bilateral with immedate capillary fill time. Pedal hair growth present. No varicosities and no lower extremity edema present bilateral.   Neruologic: Grossly intact via light touch bilateral. Vibratory intact via tuning fork bilateral. Protective threshold with Semmes Wienstein monofilament intact to all pedal sites bilateral. Patellar and Achilles deep tendon reflexes 2+ bilateral. No Babinski or clonus noted bilateral.   Musculoskeletal: No gross boney pedal deformities bilateral. No pain, crepitus, or limitation noted with foot and ankle range of motion bilateral. Muscular strength 5/5 in all groups tested bilateral.  Gait: Unassisted, Nonantalgic.    Radiographs:  None taken  Assessment & Plan:   Assessment: Ingrown toenail tibiofibular border the hallux right  Plan: Chemical matricectomy was performed after local anesthetic was injected about the first metatarsophalangeal joint of the right foot.  She tolerated procedure well was provided with both oral and written home-going instructions for the care and soaking of the toe as well as a prescription for Corticosporin otic to be applied twice daily after soaking.  She tolerated all this well I will follow-up with her in 2 weeks.     Tahari Clabaugh T. [place], [place]

## 2019-05-04 ENCOUNTER — Ambulatory Visit: Payer: Federal, State, Local not specified - PPO | Admitting: Podiatry

## 2019-05-04 ENCOUNTER — Encounter: Payer: Self-pay | Admitting: Podiatry

## 2019-05-04 ENCOUNTER — Other Ambulatory Visit: Payer: Self-pay

## 2019-05-04 DIAGNOSIS — L600 Ingrowing nail: Secondary | ICD-10-CM

## 2019-05-04 DIAGNOSIS — Z9889 Other specified postprocedural states: Secondary | ICD-10-CM

## 2019-05-04 NOTE — Progress Notes (Signed)
She presents today for a follow-up of her matrixectomy hallux right.  She states that she has been soaking on a regular basis Betadine warm water and applying Cortisporin Otic as directed.  She states that she has not had any pain since the day of the procedure.  Objective: Vital signs are stable alert oriented x3.  Pulses are palpable.  There is no erythema edema cellulitis drainage or odor.  There is some eschar present.  Assessment: Well-healing surgical toe hallux right.  Plan: Continue to soak Epson salt warm water every other day cover during the day leave open at night continue soak at least until the scabs have come off and I will follow-up with her as needed.

## 2019-11-02 ENCOUNTER — Other Ambulatory Visit: Payer: Self-pay | Admitting: Obstetrics & Gynecology

## 2019-11-02 DIAGNOSIS — Z1231 Encounter for screening mammogram for malignant neoplasm of breast: Secondary | ICD-10-CM

## 2019-11-15 ENCOUNTER — Other Ambulatory Visit: Payer: Self-pay

## 2019-11-15 ENCOUNTER — Ambulatory Visit
Admission: RE | Admit: 2019-11-15 | Discharge: 2019-11-15 | Disposition: A | Discharge status: Home / Self Care | Payer: Federal, State, Local not specified - PPO | Source: Ambulatory Visit | Attending: Obstetrics & Gynecology | Admitting: Obstetrics & Gynecology

## 2019-11-15 DIAGNOSIS — Z1231 Encounter for screening mammogram for malignant neoplasm of breast: Secondary | ICD-10-CM

## 2020-10-01 ENCOUNTER — Other Ambulatory Visit: Payer: Self-pay | Admitting: Obstetrics & Gynecology

## 2020-10-01 DIAGNOSIS — Z1231 Encounter for screening mammogram for malignant neoplasm of breast: Secondary | ICD-10-CM

## 2020-11-27 ENCOUNTER — Other Ambulatory Visit: Payer: Self-pay

## 2020-11-27 ENCOUNTER — Ambulatory Visit
Admission: RE | Admit: 2020-11-27 | Discharge: 2020-11-27 | Disposition: A | Discharge status: Home / Self Care | Payer: Federal, State, Local not specified - PPO | Source: Ambulatory Visit | Attending: Obstetrics & Gynecology | Admitting: Obstetrics & Gynecology

## 2020-11-27 DIAGNOSIS — Z1231 Encounter for screening mammogram for malignant neoplasm of breast: Secondary | ICD-10-CM

## 2021-09-30 ENCOUNTER — Other Ambulatory Visit: Payer: Self-pay | Admitting: Obstetrics & Gynecology

## 2021-09-30 DIAGNOSIS — Z1231 Encounter for screening mammogram for malignant neoplasm of breast: Secondary | ICD-10-CM

## 2021-10-08 IMAGING — MG MM DIGITAL SCREENING BILAT W/ TOMO AND CAD
8 series · 8 of 24 positions shown · non-contrast
Comparison: Previous exam(s).

CLINICAL DATA: Screening.

EXAM:
DIGITAL SCREENING BILATERAL MAMMOGRAM WITH TOMOSYNTHESIS AND CAD
TECHNIQUE: Bilateral screening digital craniocaudal and mediolateral oblique
mammograms were obtained. Bilateral screening digital breast
tomosynthesis was performed. The images were evaluated with
computer-aided detection.

[R MLO synth-2D]
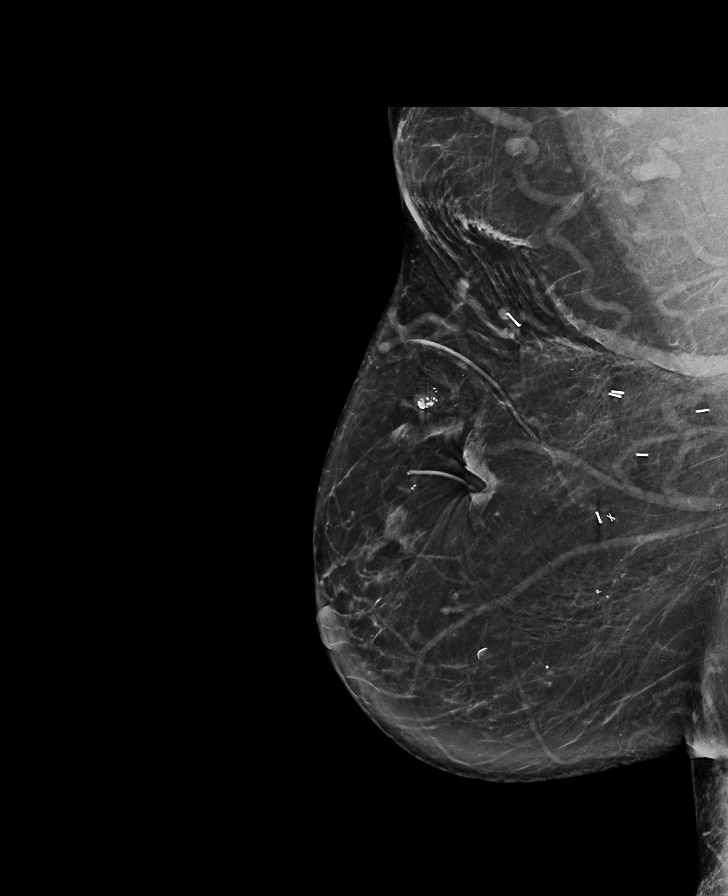

[L CC synth-2D]
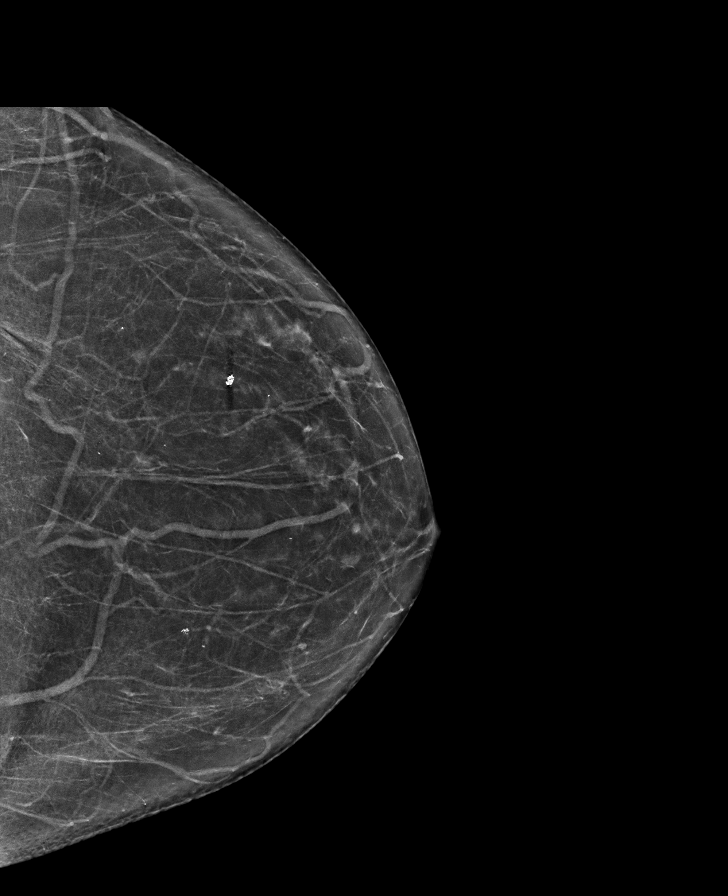

[L MLO synth-2D]
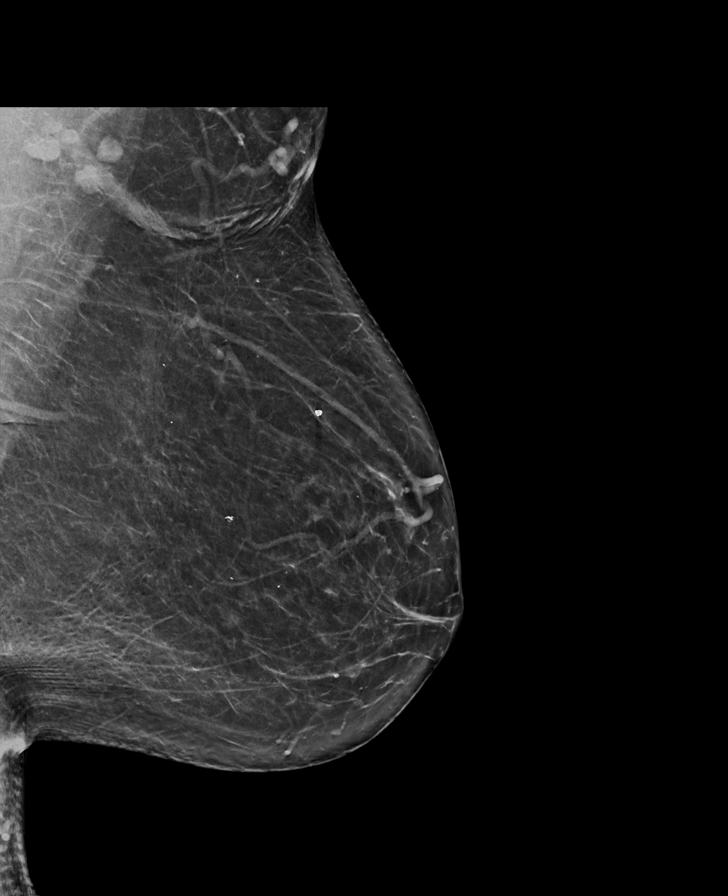

[R CC synth-2D]
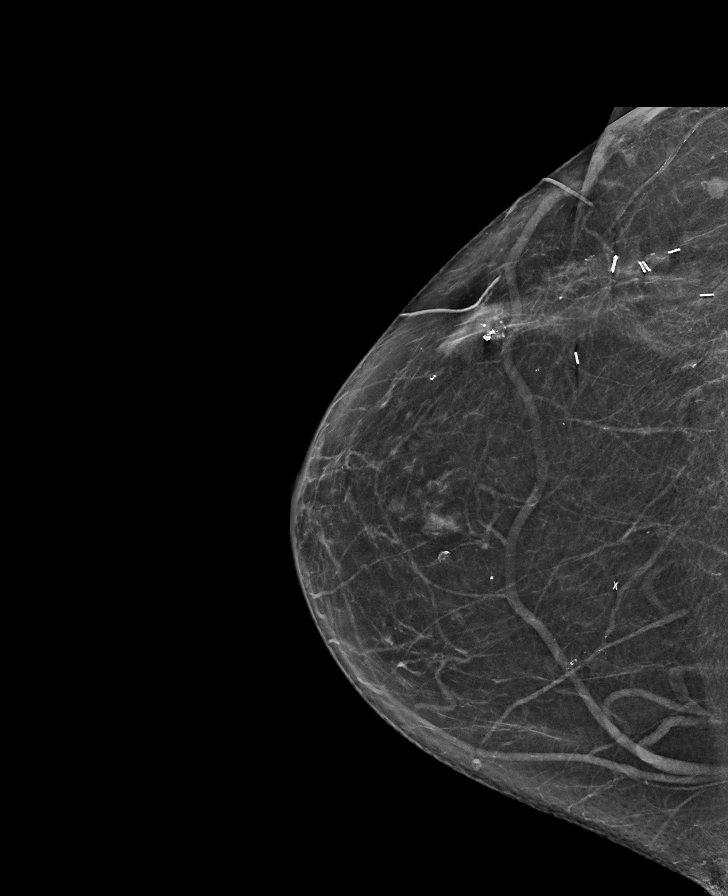

[L CC tomo · tomo slice 36/71.0]
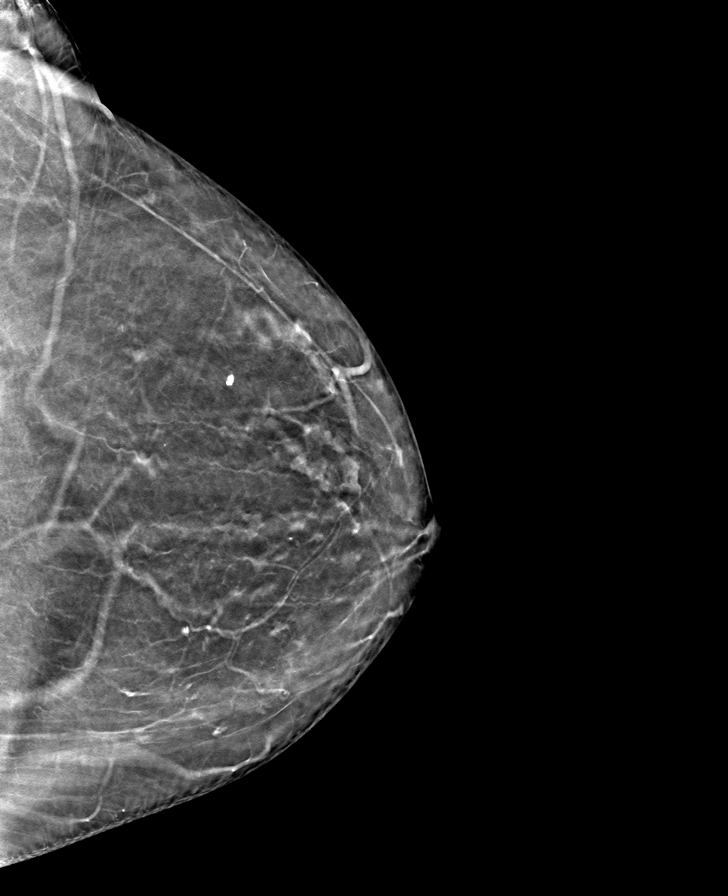

[R MLO tomo · tomo slice 41/82.0]
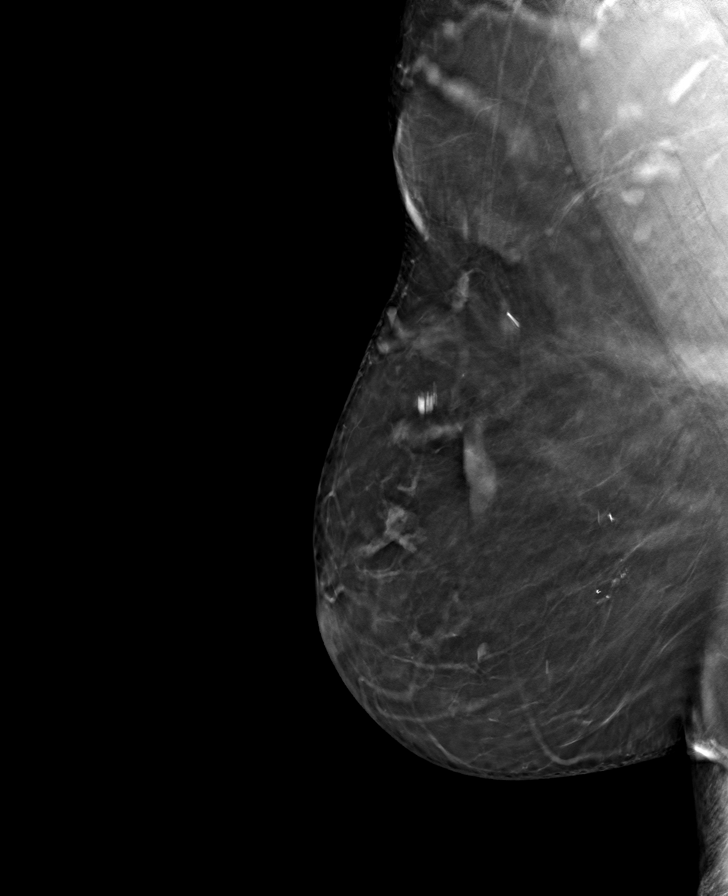

[R CC tomo · tomo slice 33/66.0]
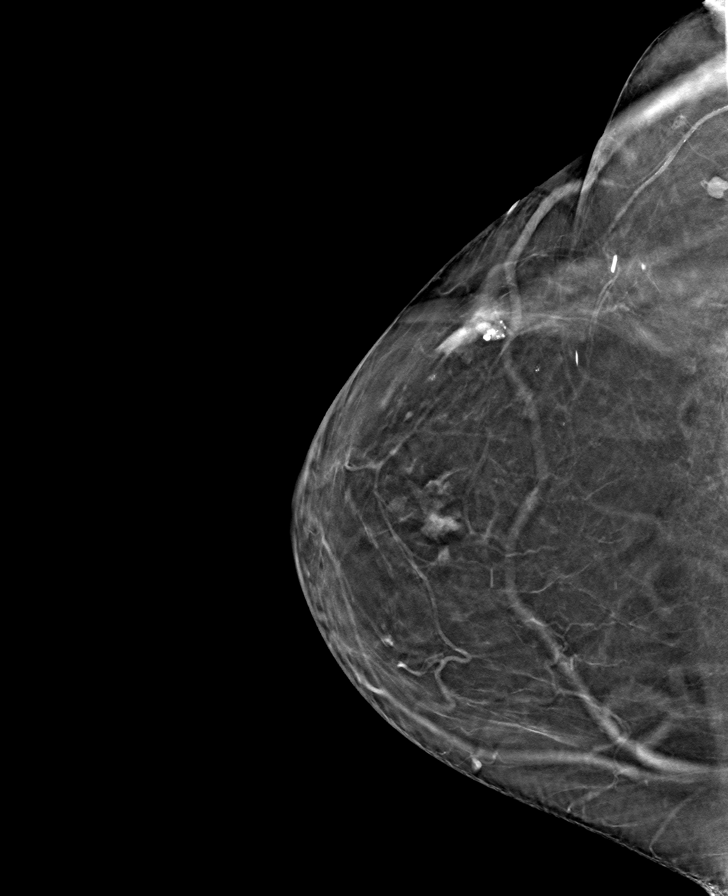

[L MLO tomo · tomo slice 41/81.0]
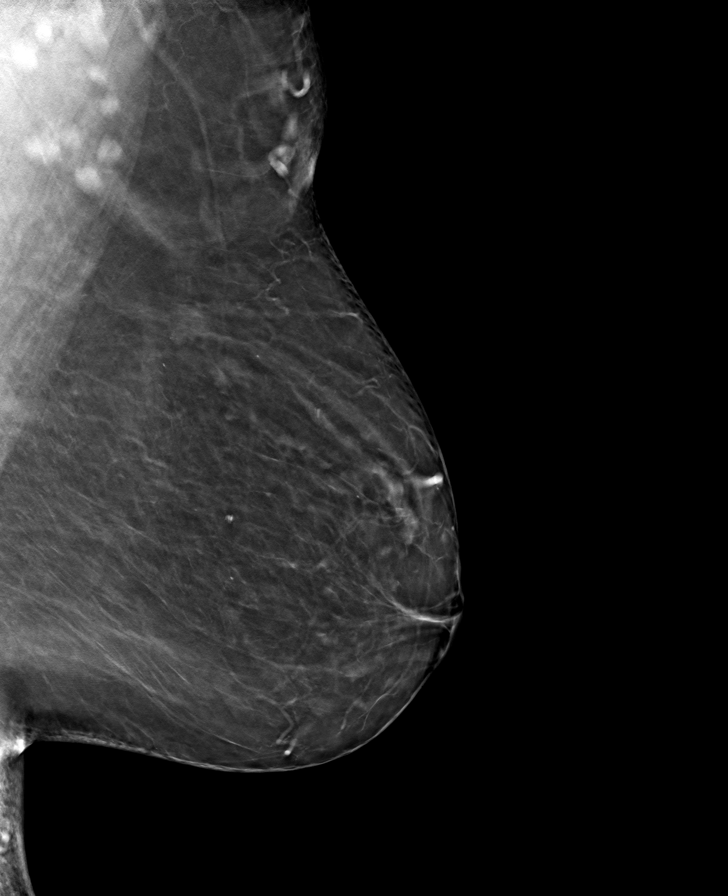

[8 of 24 positions shown; findings below may reference images not displayed]

ACR Breast Density Category b: There are scattered areas of
fibroglandular density.
FINDINGS: There are no findings suspicious for malignancy.
IMPRESSION: No mammographic evidence of malignancy. A result letter of this
screening mammogram will be mailed directly to the patient.

RECOMMENDATION:
Screening mammogram in one year. (Code:51-O-LD2)

BI-RADS CATEGORY  1: Negative.

## 2021-11-28 ENCOUNTER — Ambulatory Visit
Admission: RE | Admit: 2021-11-28 | Discharge: 2021-11-28 | Disposition: A | Discharge status: Home / Self Care | Payer: Federal, State, Local not specified - PPO | Source: Ambulatory Visit | Attending: Obstetrics & Gynecology | Admitting: Obstetrics & Gynecology

## 2021-11-28 DIAGNOSIS — Z1231 Encounter for screening mammogram for malignant neoplasm of breast: Secondary | ICD-10-CM

## 2022-10-27 ENCOUNTER — Other Ambulatory Visit: Payer: Self-pay | Admitting: Physician Assistant

## 2022-10-27 DIAGNOSIS — Z1231 Encounter for screening mammogram for malignant neoplasm of breast: Secondary | ICD-10-CM

## 2022-12-01 ENCOUNTER — Ambulatory Visit: Payer: Federal, State, Local not specified - PPO

## 2022-12-01 ENCOUNTER — Ambulatory Visit
Admission: RE | Admit: 2022-12-01 | Discharge: 2022-12-01 | Disposition: A | Discharge status: Home / Self Care | Payer: Federal, State, Local not specified - PPO | Source: Ambulatory Visit | Attending: Physician Assistant | Admitting: Physician Assistant

## 2022-12-01 DIAGNOSIS — Z1231 Encounter for screening mammogram for malignant neoplasm of breast: Secondary | ICD-10-CM

## 2023-10-19 ENCOUNTER — Other Ambulatory Visit: Payer: Self-pay | Admitting: Physician Assistant

## 2023-10-19 DIAGNOSIS — Z1231 Encounter for screening mammogram for malignant neoplasm of breast: Secondary | ICD-10-CM

## 2023-12-07 ENCOUNTER — Ambulatory Visit
Admission: RE | Admit: 2023-12-07 | Discharge: 2023-12-07 | Disposition: A | Discharge status: Home / Self Care | Source: Ambulatory Visit | Attending: Physician Assistant | Admitting: Physician Assistant

## 2023-12-07 DIAGNOSIS — Z1231 Encounter for screening mammogram for malignant neoplasm of breast: Secondary | ICD-10-CM
